# Patient Record
Sex: Female | Born: 1965 | Race: White | Hispanic: No | Marital: Married | Smoking: Former smoker
Health system: Southern US, Community
[De-identification: ages and names within clinical notes are randomized; demographics above are authoritative.]

## PROBLEM LIST (undated history)

## (undated) DIAGNOSIS — E785 Hyperlipidemia, unspecified: Secondary | ICD-10-CM

## (undated) DIAGNOSIS — N390 Urinary tract infection, site not specified: Secondary | ICD-10-CM

## (undated) DIAGNOSIS — M5430 Sciatica, unspecified side: Secondary | ICD-10-CM

## (undated) DIAGNOSIS — K760 Fatty (change of) liver, not elsewhere classified: Secondary | ICD-10-CM

## (undated) HISTORY — PX: ABDOMINAL HYSTERECTOMY: SHX81

## (undated) HISTORY — PX: TONSILLECTOMY: SUR1361

## (undated) HISTORY — DX: Sciatica, unspecified side: M54.30

## (undated) HISTORY — DX: Urinary tract infection, site not specified: N39.0

## (undated) HISTORY — DX: Hyperlipidemia, unspecified: E78.5

## (undated) HISTORY — DX: Fatty (change of) liver, not elsewhere classified: K76.0

---

## 2012-04-13 HISTORY — PX: AUGMENTATION MAMMAPLASTY: SUR837

## 2015-12-17 LAB — LIPID PANEL
Cholesterol: 193 (ref 0–200)
HDL: 58 (ref 35–70)
LDL CALC: 112
TRIGLYCERIDES: 116 (ref 40–160)

## 2015-12-17 LAB — HEMOGLOBIN A1C: Hemoglobin A1C: 5.4

## 2016-03-18 LAB — HEPATIC FUNCTION PANEL
ALK PHOS: 55 (ref 25–125)
ALT: 24 (ref 7–35)
AST: 18 (ref 13–35)
BILIRUBIN, TOTAL: 0.4

## 2016-03-18 LAB — LIPID PANEL
CHOLESTEROL: 199 (ref 0–200)
HDL: 62 (ref 35–70)
LDL Cholesterol: 115
TRIGLYCERIDES: 111 (ref 40–160)

## 2016-03-18 LAB — CBC AND DIFFERENTIAL
HCT: 43 (ref 36–46)
Hemoglobin: 14.6 (ref 12.0–16.0)

## 2016-03-18 LAB — BASIC METABOLIC PANEL
BUN: 18 (ref 4–21)
Creatinine: 18 — AB (ref 0.5–1.1)
GLUCOSE: 97
Potassium: 4.2 (ref 3.4–5.3)
SODIUM: 140 (ref 137–147)

## 2016-03-18 LAB — HEMOGLOBIN A1C: HEMOGLOBIN A1C: 5.2

## 2016-12-04 ENCOUNTER — Encounter: Payer: Self-pay | Admitting: Family Medicine

## 2016-12-04 ENCOUNTER — Ambulatory Visit (INDEPENDENT_AMBULATORY_CARE_PROVIDER_SITE_OTHER): Payer: 59 | Admitting: Family Medicine

## 2016-12-04 VITALS — BP 110/76 | HR 72 | Resp 12 | Ht 65.0 in | Wt 173.2 lb

## 2016-12-04 DIAGNOSIS — R160 Hepatomegaly, not elsewhere classified: Secondary | ICD-10-CM | POA: Diagnosis not present

## 2016-12-04 DIAGNOSIS — Z789 Other specified health status: Secondary | ICD-10-CM

## 2016-12-04 DIAGNOSIS — Z111 Encounter for screening for respiratory tuberculosis: Secondary | ICD-10-CM

## 2016-12-04 DIAGNOSIS — Z23 Encounter for immunization: Secondary | ICD-10-CM

## 2016-12-04 DIAGNOSIS — E785 Hyperlipidemia, unspecified: Secondary | ICD-10-CM

## 2016-12-04 DIAGNOSIS — Z020 Encounter for examination for admission to educational institution: Secondary | ICD-10-CM | POA: Diagnosis not present

## 2016-12-04 DIAGNOSIS — K769 Liver disease, unspecified: Secondary | ICD-10-CM | POA: Diagnosis not present

## 2016-12-04 MED ORDER — SIMVASTATIN 20 MG PO TABS: 20.0000 mg | ORAL_TABLET | Freq: Every day | ORAL | 3 refills | Status: DC

## 2016-12-04 NOTE — Progress Notes (Signed)
HPI:   Erica Dickson is a 51 y.o. female, who is here today to establish care.  Former PCP: Dr Sim Boast Last preventive routine visit: 03/2016 and gyn preventive in 04/2016 (Dr Mancel Bale).  She lives with her husband and 67 yo daughter. Chronic medical problems: HLD, liver mass right lobe, sciatic pain, hepatic steatosis, and elevated LFT's among some.   Concerns today: Completion of form for school, LPN program. She reports vaccines as up to date but does not have records. She is from [place] and had varicella during childhood.  She also would like labs done, including TSH, denies prior Hx of thyroid vaccines.  HLD, currently she is on   Liver mass: According to pt, last year she had 2 liver MRI and liver mass has ben stable.Dx incidentally by her gyn on 09/10/2015. She took OCP's for short period of time in the past. She has followed with Dr Edison Pace (hepatologists) at [facility] and has had otherwise negative work up: Iron 115, ferritin 133, tibc 324 Celiac neg Ceruloplasmin 29 ASMA neg ANA neg IgG nml HCV ab neg HBV sAg neg Also AFP , Ca Ag 19-9,and CEA in normal range.  Annual f/u with MRI was recommended, because she recently relocated she prefers to have MRI arrange locally, she is cancelling MRI appt at [facility].  She denies abdominal pain, nausea or vomiting. Denies Hx of alcohol abuse.  MRI of abdomen w and w/o contrast 01/2016:  No significant growth in the previously identified two sites of focal nodular hyperplasia. Hepatic steatosis. Redemonstration of minimally T1 hypointense and T2 hyperintense hepatic lesions; segment VII 3.4 x 3.2 x 3.6 cm lesion was previously measured 3.5 x 3.5 cm; segment IV B 5.9 x 3.7 x 8.0 cm lesion was previously measured 6.4 x 4.3 cm   Review of Systems  Constitutional: Negative for activity change, appetite change, fatigue, fever and unexpected weight change.  HENT: Negative for mouth sores, nosebleeds and trouble swallowing.   Eyes:  Negative for redness and visual disturbance.  Respiratory: Negative for cough, shortness of breath and wheezing.   Cardiovascular: Negative for chest pain, palpitations and leg swelling.  Gastrointestinal: Negative for abdominal pain, nausea and vomiting.       Negative for changes in bowel habits.  Endocrine: Negative for cold intolerance, heat intolerance, polydipsia, polyphagia and polyuria.  Genitourinary: Negative for decreased urine volume, dysuria and hematuria.  Musculoskeletal: Negative for gait problem and myalgias.  Skin: Negative for rash.  Neurological: Negative for syncope, weakness, numbness and headaches.  Hematological: Negative for adenopathy. Does not bruise/bleed easily.  Psychiatric/Behavioral: Negative for confusion and sleep disturbance. The patient is nervous/anxious.     No current outpatient prescriptions on file prior to visit.   No current facility-administered medications on file prior to visit.      Past Medical History:  Diagnosis Date  . Hepatic steatosis    MRI 01/2016  . Hyperlipidemia   . Sciatic pain   . Urinary tract infection    Past Surgical History:  Procedure Laterality Date  . ABDOMINAL HYSTERECTOMY    . TONSILLECTOMY      No Known Allergies  Family History  Problem Relation Age of Onset  . Hyperlipidemia Mother   . Heart disease Mother   . Hypertension Mother   . Stroke Father   . Diabetes Father   . Heart disease Brother 22       MI    Social History   Social History  . Marital  status: Married    Spouse name: N/A  . Number of children: N/A  . Years of education: N/A   Social History Main Topics  . Smoking status: Former Research scientist (life sciences)  . Smokeless tobacco: Never Used  . Alcohol use Yes  . Drug use: No  . Sexual activity: Not Asked   Other Topics Concern  . None   Social History Narrative  . None    Vitals:   12/04/16 1348  BP: 110/76  Pulse: 72  Resp: 12  SpO2: 98%    Body mass index is 28.83  kg/m.  Physical Exam  Nursing note and vitals reviewed. Constitutional: She is oriented to person, place, and time. She appears well-developed. No distress.  HENT:  Head: Normocephalic and atraumatic.  Right Ear: Hearing, tympanic membrane, external ear and ear canal normal.  Left Ear: Hearing, tympanic membrane, external ear and ear canal normal.  Mouth/Throat: Oropharynx is clear and moist and mucous membranes are normal.  Eyes: Pupils are equal, round, and reactive to light. Conjunctivae and EOM are normal.  Cardiovascular: Normal rate and regular rhythm.   No murmur heard. Pulses:      Dorsalis pedis pulses are 2+ on the right side, and 2+ on the left side.  Respiratory: Effort normal and breath sounds normal. No respiratory distress.  GI: Soft. She exhibits no mass. There is no hepatomegaly. There is no tenderness.  Musculoskeletal: She exhibits no edema.  Lymphadenopathy:    She has no cervical adenopathy.  Neurological: She is alert and oriented to person, place, and time. She has normal strength. No cranial nerve deficit. Coordination and gait normal.  Skin: Skin is warm. No erythema.  Psychiatric: She has a normal mood and affect.  Well groomed, good eye contact.    ASSESSMENT AND PLAN:   Ms. Parisa was seen today for establish care.  Diagnoses and all orders for this visit:  School health examination  Form completed. MMR given. Varicella titers adding to future lab.  Hyperlipidemia, unspecified hyperlipidemia type  No changes in current management. We will follow labs and give further recommendations accordingly. F/U in 6-12 months.  -     simvastatin (ZOCOR) 20 MG tablet; Take 1 tablet (20 mg total) by mouth daily. -     Comprehensive metabolic panel; Future -     Lipid panel; Future -     TSH; Future  Varicella vaccination status unknown -     Varicella Zoster Antibody, IgG; Future  Screening-pulmonary TB -     Quantiferon tb gold assay (blood);  Future  Liver mass, right lobe  Asymptomatic. Has had abnormal LFT's. Records from Dr Edison Pace at [facility] reviewed. Abdominal MRI W and w/o contrast ordered, she is due in 01/2017.  -     MR Abdomen W Wo Contrast; Future  Liver disease -     MR Abdomen W Wo Contrast; Future  Need for MMR vaccine -     MMR vaccine subcutaneous     Lugene Beougher G. [place], MD  [facility]. [place] office.

## 2016-12-04 NOTE — Patient Instructions (Addendum)
A few things to remember from today's visit:   Varicella vaccination status unknown - Plan: Varicella Zoster Antibody, IgG  Hyperlipidemia, unspecified hyperlipidemia type - Plan: simvastatin (ZOCOR) 20 MG tablet, Comprehensive metabolic panel, Lipid panel, TSH  Screening-pulmonary TB - Plan: Quantiferon tb gold assay (blood)  I will review records allopurinol about a liver MRI.   Please be sure medication list is accurate. If a new problem present, please set up appointment sooner than planned today.

## 2016-12-09 ENCOUNTER — Other Ambulatory Visit (INDEPENDENT_AMBULATORY_CARE_PROVIDER_SITE_OTHER): Payer: 59

## 2016-12-09 DIAGNOSIS — Z111 Encounter for screening for respiratory tuberculosis: Secondary | ICD-10-CM

## 2016-12-09 DIAGNOSIS — E785 Hyperlipidemia, unspecified: Secondary | ICD-10-CM

## 2016-12-09 DIAGNOSIS — Z789 Other specified health status: Secondary | ICD-10-CM | POA: Diagnosis not present

## 2016-12-09 LAB — COMPREHENSIVE METABOLIC PANEL
ALBUMIN: 4.6 g/dL (ref 3.5–5.2)
ALK PHOS: 53 U/L (ref 39–117)
ALT: 18 U/L (ref 0–35)
AST: 20 U/L (ref 0–37)
BILIRUBIN TOTAL: 0.6 mg/dL (ref 0.2–1.2)
BUN: 15 mg/dL (ref 6–23)
CALCIUM: 9.6 mg/dL (ref 8.4–10.5)
CO2: 28 mEq/L (ref 19–32)
CREATININE: 0.82 mg/dL (ref 0.40–1.20)
Chloride: 101 mEq/L (ref 96–112)
GFR: 77.94 mL/min (ref >60.00)
Glucose, Bld: 97 mg/dL (ref 70–99)
Potassium: 4.2 mEq/L (ref 3.5–5.1)
Sodium: 136 mEq/L (ref 135–145)
Total Protein: 7.3 g/dL (ref 6.0–8.3)

## 2016-12-09 LAB — LIPID PANEL
CHOLESTEROL: 201 mg/dL — AB (ref 0–200)
HDL: 59.3 mg/dL (ref >39.00)
LDL Cholesterol: 118 mg/dL — ABNORMAL HIGH (ref 0–99)
NonHDL: 142.02
TRIGLYCERIDES: 120 mg/dL (ref 0.0–149.0)
Total CHOL/HDL Ratio: 3
VLDL: 24 mg/dL (ref 0.0–40.0)

## 2016-12-09 LAB — TSH: TSH: 2.16 u[IU]/mL (ref 0.35–4.50)

## 2016-12-10 LAB — VARICELLA ZOSTER ANTIBODY, IGG: VARICELLA IGG: 3007 {index} — AB (ref <135.00)

## 2016-12-11 LAB — QUANTIFERON TB GOLD ASSAY (BLOOD)
INTERFERON GAMMA RELEASE ASSAY: NEGATIVE
QUANTIFERON TB AG MINUS NIL: 0 [IU]/mL
Quantiferon Nil Value: 0.04 IU/mL

## 2017-01-18 ENCOUNTER — Telehealth: Payer: Self-pay | Admitting: *Deleted

## 2017-01-18 ENCOUNTER — Ambulatory Visit
Admission: RE | Admit: 2017-01-18 | Discharge: 2017-01-18 | Disposition: A | Discharge status: Home / Self Care | Payer: 59 | Source: Ambulatory Visit | Attending: Family Medicine | Admitting: Family Medicine

## 2017-01-18 DIAGNOSIS — R160 Hepatomegaly, not elsewhere classified: Secondary | ICD-10-CM

## 2017-01-18 DIAGNOSIS — K769 Liver disease, unspecified: Secondary | ICD-10-CM

## 2017-01-18 MED ORDER — GADOBENATE DIMEGLUMINE 529 MG/ML IV SOLN
15.0000 mL | Freq: Once | INTRAVENOUS | Status: AC | PRN
  Administered 2017-01-18: 15 mL via INTRAVENOUS

## 2017-01-18 NOTE — Telephone Encounter (Signed)
Morrie Sheldon from [facility] called requesting reports from a prior MRI of the liver that was performed at [facility].  Please fax to [phone] attn: MRI dept.

## 2017-01-19 NOTE — Telephone Encounter (Signed)
MRI faxed to [facility].

## 2017-01-19 NOTE — Telephone Encounter (Signed)
We do not have the MRI on file, I will contact [facility] to have them fax it over to [place].

## 2017-01-19 NOTE — Telephone Encounter (Signed)
[facility] will be faxing over both copies of patient's previous liver MRI's.

## 2017-01-21 ENCOUNTER — Telehealth: Payer: Self-pay | Admitting: Family Medicine

## 2017-01-21 NOTE — Telephone Encounter (Signed)
° ° °  Pt call and would like a call back about her MRI results

## 2017-01-22 NOTE — Telephone Encounter (Signed)
As of now, the MRI has not been read yet.

## 2017-01-28 ENCOUNTER — Telehealth: Payer: Self-pay | Admitting: Family Medicine

## 2017-01-28 NOTE — Telephone Encounter (Signed)
Pt is calling to get the MR results from 01/18/17.

## 2017-01-29 ENCOUNTER — Encounter: Payer: Self-pay | Admitting: Family Medicine

## 2017-01-29 NOTE — Telephone Encounter (Signed)
The results have been entered.

## 2017-01-29 NOTE — Telephone Encounter (Signed)
I called and left a message with [facility]. The MRI has still not been read at this time. They will look into it and give us a call back.

## 2017-01-29 NOTE — Telephone Encounter (Signed)
Received a call back from [facility]. They will have the results in for the MRI today.

## 2017-02-01 NOTE — Telephone Encounter (Signed)
Results sent to patient

## 2017-03-24 ENCOUNTER — Other Ambulatory Visit: Payer: Self-pay | Admitting: Family Medicine

## 2017-03-24 DIAGNOSIS — Z1231 Encounter for screening mammogram for malignant neoplasm of breast: Secondary | ICD-10-CM

## 2017-04-22 ENCOUNTER — Ambulatory Visit
Admission: RE | Admit: 2017-04-22 | Discharge: 2017-04-22 | Disposition: A | Discharge status: Home / Self Care | Payer: 59 | Source: Ambulatory Visit | Attending: Family Medicine | Admitting: Family Medicine

## 2017-04-22 DIAGNOSIS — Z1231 Encounter for screening mammogram for malignant neoplasm of breast: Secondary | ICD-10-CM

## 2017-12-27 ENCOUNTER — Telehealth: Payer: Self-pay

## 2017-12-27 NOTE — Telephone Encounter (Signed)
Copied from CRM [phone]. Topic: Inquiry >> Dec 27, 2017  3:23 PM Debroah LoopLander, Lumin L wrote: Reason for CRM: Patient would like to transfer PCP from Dr. Sw[place]il[place]Jordan at [facility] to Dr. Doreene BurkeKremer at [facility]. Please advise on whether request is approved or denied.   Dr. SwazilandJordan - Please advise on transfer request. Thank you!

## 2017-12-27 NOTE — Telephone Encounter (Signed)
It is fine with me. Thanks, BJ 

## 2017-12-28 NOTE — Telephone Encounter (Signed)
Dr. SwazilandJordan has approved transfer request. Please forward to Dr. Doreene BurkeKremer. Thank you!

## 2017-12-29 NOTE — Telephone Encounter (Signed)
Dr. Doreene BurkeKremer, please advise if transfer is okay.

## 2017-12-29 NOTE — Telephone Encounter (Signed)
See below

## 2017-12-30 NOTE — Telephone Encounter (Signed)
Okay to transfer  

## 2017-12-30 NOTE — Telephone Encounter (Signed)
Okay to schedule patient. 

## 2018-02-09 ENCOUNTER — Other Ambulatory Visit: Payer: Self-pay | Admitting: Family Medicine

## 2018-02-09 DIAGNOSIS — E785 Hyperlipidemia, unspecified: Secondary | ICD-10-CM

## 2018-02-09 NOTE — Telephone Encounter (Signed)
I called and left message on patient voice mail to call office and schedule appointment to see Dr. Doreene Burke. TOC-was approved to transfer to [facility]. (PEC - when patient returns call ok to schedule TOC with Dr. Doreene Burke.

## 2018-02-09 NOTE — Telephone Encounter (Signed)
There is no documentation this pt has been contacted re: TOC to [facility]. Please contact pt and schedule appt. Thank you.

## 2018-02-10 ENCOUNTER — Encounter: Payer: Self-pay | Admitting: Family Medicine

## 2018-02-10 ENCOUNTER — Ambulatory Visit (INDEPENDENT_AMBULATORY_CARE_PROVIDER_SITE_OTHER): Payer: PRIVATE HEALTH INSURANCE | Admitting: Family Medicine

## 2018-02-10 VITALS — BP 110/80 | HR 62 | Temp 97.8°F | Wt 163.0 lb

## 2018-02-10 DIAGNOSIS — E782 Mixed hyperlipidemia: Secondary | ICD-10-CM | POA: Diagnosis not present

## 2018-02-10 DIAGNOSIS — K7689 Other specified diseases of liver: Secondary | ICD-10-CM | POA: Diagnosis not present

## 2018-02-10 NOTE — Patient Instructions (Signed)
 Dyslipidemia Dyslipidemia is an imbalance of waxy, fat-like substances (lipids) in the blood. The body needs lipids in small amounts. Dyslipidemia often involves a high level of cholesterol or triglycerides, which are types of lipids. Common forms of dyslipidemia include:  High levels of bad cholesterol (LDL cholesterol). LDL is the type of cholesterol that causes fatty deposits (plaques) to build up in the blood vessels that carry blood away from your heart (arteries).  Low levels of good cholesterol (HDL cholesterol). HDL cholesterol is the type of cholesterol that protects against heart disease. High levels of HDL remove the LDL buildup from arteries.  High levels of triglycerides. Triglycerides are a fatty substance in the blood that is linked to a buildup of plaques in the arteries.  You can develop dyslipidemia because of the genes you are born with (primary dyslipidemia) or changes that occur during your life (secondary dyslipidemia), or as a side effect of certain medical treatments. What are the causes? Primary dyslipidemia is caused by changes (mutations) in genes that are passed down through families (inherited). These mutations cause several types of dyslipidemia. Mutations can result in disorders that make the body produce too much LDL cholesterol or triglycerides, or not enough HDL cholesterol. These disorders may lead to heart disease, arterial disease, or stroke at an early age. Causes of secondary dyslipidemia include certain lifestyle choices and diseases that lead to dyslipidemia, such as:  Eating a diet that is high in animal fat.  Not getting enough activity or exercise (having a sedentary lifestyle).  Having diabetes, kidney disease, liver disease, or thyroid disease.  Drinking large amounts of alcohol.  Using certain types of drugs.  What increases the risk? You may be at greater risk for dyslipidemia if you are an older man or if you are a woman who has gone  through menopause. Other risk factors include:  Having a family history of dyslipidemia.  Taking certain medicines, including birth control pills, steroids, some diuretics, beta-blockers, and some medicines forHIV.  Smoking cigarettes.  Eating a high-fat diet.  Drinking large amounts of alcohol.  Having certain medical conditions such as diabetes, polycystic ovary syndrome (PCOS), pregnancy, kidney disease, liver disease, or hypothyroidism.  Not exercising regularly.  Being overweight or obese with too much belly fat.  What are the signs or symptoms? Dyslipidemia does not usually cause any symptoms. Very high lipid levels can cause fatty bumps under the skin (xanthomas) or a white or gray ring around the black center (pupil) of the eye. Very high triglyceride levels can cause inflammation of the pancreas (pancreatitis). How is this diagnosed? Your health care provider may diagnose dyslipidemia based on a routine blood test (fasting blood test). Because most people do not have symptoms of the condition, this blood testing (lipid profile) is done on adults age 20 and older and is repeated every 5 years. This test checks:  Total cholesterol. This is a measure of the total amount of cholesterol in your blood, including LDL cholesterol, HDL cholesterol, and triglycerides. A healthy number is below 200.  LDL cholesterol. The target number for LDL cholesterol is different for each person, depending on individual risk factors. For most people, a number below 100 is healthy. Ask your health care provider what your LDL cholesterol number should be.  HDL cholesterol. An HDL level of 60 or higher is best because it helps to protect against heart disease. A number below 40 for men or below 50 for women increases the risk for heart disease.  Triglycerides.   A healthy triglyceride number is below 150.  If your lipid profile is abnormal, your health care provider may do other blood tests to get more  information about your condition. How is this treated? Treatment depends on the type of dyslipidemia that you have and your other risk factors for heart disease and stroke. Your health care provider will have a target range for your lipid levels based on this information. For many people, treatment starts with lifestyle changes, such as diet and exercise. Your health care provider may recommend that you:  Get regular exercise.  Make changes to your diet.  Quit smoking if you smoke.  If diet changes and exercise do not help you reach your goals, your health care provider may also prescribe medicine to lower lipids. The most commonly prescribed type of medicine lowers your LDL cholesterol (statin drug). If you have a high triglyceride level, your provider may prescribe another type of drug (fibrate) or an omega-3 fish oil supplement, or both. Follow these instructions at home:  Take over-the-counter and prescription medicines only as told by your health care provider. This includes supplements.  Get regular exercise. Start an aerobic exercise and strength training program as told by your health care provider. Ask your health care provider what activities are safe for you. Your health care provider may recommend: ? 30 minutes of aerobic activity 4-6 days a week. Brisk walking is an example of aerobic activity. ? Strength training 2 days a week.  Eat a healthy diet as told by your health care provider. This can help you reach and maintain a healthy weight, lower your LDL cholesterol, and raise your HDL cholesterol. It may help to work with a diet and nutrition specialist (dietitian) to make a plan that is right for you. Your dietitian or health care provider may recommend: ? Limiting your calories, if you are overweight. ? Eating more fruits, vegetables, whole grains, fish, and lean meats. ? Limiting saturated fat, trans fat, and cholesterol.  Follow instructions from your health care provider  or dietitian about eating or drinking restrictions.  Limit alcohol intake to no more than one drink per day for nonpregnant women and two drinks per day for men. One drink equals 12 oz of beer, 5 oz of wine, or 1 oz of hard liquor.  Do not use any products that contain nicotine or tobacco, such as cigarettes and e-cigarettes. If you need help quitting, ask your health care provider.  Keep all follow-up visits as told by your health care provider. This is important. Contact a health care provider if:  You are having trouble sticking to your exercise or diet plan.  You are struggling to quit smoking or control your use of alcohol. Summary  Dyslipidemia is an imbalance of waxy, fat-like substances (lipids) in the blood. The body needs lipids in small amounts. Dyslipidemia often involves a high level of cholesterol or triglycerides, which are types of lipids.  Treatment depends on the type of dyslipidemia that you have and your other risk factors for heart disease and stroke.  For many people, treatment starts with lifestyle changes, such as diet and exercise. Your health care provider may also prescribe medicine to lower lipids. This information is not intended to replace advice given to you by your health care provider. Make sure you discuss any questions you have with your health care provider. Document Released: 04/04/2013 Document Revised: 11/25/2015 Document Reviewed: 11/25/2015 Elsevier Interactive Patient Education  2018 Elsevier Inc.  

## 2018-02-10 NOTE — Progress Notes (Addendum)
Subjective:  Patient ID: Erica Dickson, female    DOB: Dec 06, 1965  Age: 51 y.o. MRN: [id number]  CC: Annual Exam   HPI Erica Dickson presents for follow-up of her elevated cholesterol.  She has been off of her Zocor for the last 9 months.  She is lost weight and been enjoying a heart healthy diet.  [facility].  She quit smoking 15 years ago.  She drinks rarely.  She lives with her husband.  Together they have a daughter who is now in the nursing school in [place].  There are 2 children from a previous marriage on the husband's side.  Patient has had a hysterectomy.  She has Pap smears every 2 to 3 years with her GYN.  She had a colonoscopy that was normal and is not due for another one until she turns 60.  At some point she was diagnosed with liver masses believed to be focal nodular hyperplasia.  MRIs done a year apart in 2017 and 2018 showed these lesions to be stable.   Suggested follow-up for these lesions was not exactly clear .  Patient would like to avoid another MRI if possible.  Outpatient Medications Prior to Visit  Medication Sig Dispense Refill  . Multiple Vitamin (MULTI-VITAMINS) TABS Take 1 tablet by mouth daily.    . simvastatin (ZOCOR) 20 MG tablet TAKE 1 TABLET DAILY 90 tablet 0   No facility-administered medications prior to visit.     ROS Review of Systems  Constitutional: Negative.   HENT: Negative.   Eyes: Negative.   Respiratory: Negative.   Cardiovascular: Negative.   Gastrointestinal: Negative.   Endocrine: Negative for polyphagia and polyuria.  Genitourinary: Negative.   Musculoskeletal: Negative.   Neurological: Negative.   Hematological: Negative.   Psychiatric/Behavioral: Negative.     Objective:  BP 110/80   Pulse 62   Temp 97.8 F (36.6 C) (Oral)   Wt 163 lb (73.9 kg)   SpO2 97%   BMI 27.12 kg/m   BP Readings from Last 3 Encounters:  02/10/18 110/80  12/04/16 110/76    Wt Readings  from Last 3 Encounters:  02/10/18 163 lb (73.9 kg)  12/04/16 173 lb 4 oz (78.6 kg)    Physical Exam  Constitutional: She is oriented to person, place, and time. She appears well-developed and well-nourished. No distress.  HENT:  Head: Normocephalic and atraumatic.  Right Ear: External ear normal.  Left Ear: External ear normal.  Mouth/Throat: Oropharynx is clear and moist. No oropharyngeal exudate.  Eyes: Pupils are equal, round, and reactive to light. EOM are normal. Right eye exhibits no discharge. Left eye exhibits no discharge. No scleral icterus.  Neck: Neck supple. No JVD present. No tracheal deviation present. No thyromegaly present.  Cardiovascular: Normal rate, regular rhythm and normal heart sounds.  Pulmonary/Chest: Effort normal and breath sounds normal.  Musculoskeletal: She exhibits no edema.  Lymphadenopathy:    She has no cervical adenopathy.  Neurological: She is alert and oriented to person, place, and time.  Skin: Skin is warm and dry. Capillary refill takes less than 2 seconds. She is not diaphoretic.  Psychiatric: She has a normal mood and affect. Her behavior is normal.    Lab Results  Component Value Date   HGB 14.6 03/18/2016   HCT 43 03/18/2016   GLUCOSE 97 12/09/2016   CHOL 201 (H) 12/09/2016   TRIG 120.0 12/09/2016   HDL 59.30 12/09/2016  LDLCALC 118 (H) 12/09/2016   ALT 18 12/09/2016   AST 20 12/09/2016   NA 136 12/09/2016   K 4.2 12/09/2016   CL 101 12/09/2016   CREATININE 0.82 12/09/2016   BUN 15 12/09/2016   CO2 28 12/09/2016   TSH 2.16 12/09/2016   HGBA1C 5.2 03/18/2016    Mm Screening Breast W/implant Tomo Bilateral  Result Date: 04/22/2017 CLINICAL DATA:  Screening. EXAM: DIGITAL SCREENING BILATERAL MAMMOGRAM WITH IMPLANTS, CAD AND ADJUNCT TOMO The patient has retropectoral implants. Standard and implant displaced views were performed. COMPARISON:  Previous exam(s). ACR Breast Density Category b: There are scattered areas of  fibroglandular density. FINDINGS: There are no findings suspicious for malignancy. Images were processed with CAD. IMPRESSION: No mammographic evidence of malignancy. A result letter of this screening mammogram will be mailed directly to the patient. RECOMMENDATION: Screening mammogram in one year. (Code:SM-B-01Y) BI-RADS CATEGORY  1:  Negative. Electronically Signed   By: Gerome Sam III M.D   On: 04/22/2017 16:45   The 10-year ASCVD risk score Denman George DC Montez Hageman., et al., 2013) is: 1%   Values used to calculate the score:     Age: 35 years     Sex: Female     Is Non-Hispanic African American: No     Diabetic: No     Tobacco smoker: No     Systolic Blood Pressure: 110 mmHg     Is BP treated: No     HDL Cholesterol: 82 mg/dL     Total Cholesterol: 260 mg/dL Assessment & Plan:   Erica Dickson was seen today for annual exam.  Diagnoses and all orders for this visit:  Mixed hyperlipidemia -     Cancel: CBC -     Cancel: Comprehensive metabolic panel -     Cancel: Lipid panel -     CBC -     Comprehensive metabolic panel -     Lipid panel -     Urinalysis, Routine w reflex microscopic  Hepatic focal nodular hyperplasia -     US Abdomen Limited; Future -     CBC -     Comprehensive metabolic panel  Other orders -     Cancel: Urinalysis, Routine w reflex microscopic   I have discontinued Erica Dickson's simvastatin. I am also having her maintain her MULTI-VITAMINS.  No orders of the defined types were placed in this encounter.  We will see where patient's lipid profile is off of Zocor.  Patient has improved her diet immensely and is exercising regularly.  Anticipatory guidance was given to the patient for dyslipidemia.  Have ordered ultrasound to follow-up the patient's liver masses.  There is been to back to back MRIs showed these lesions to be stable.  Patient would like to avoid another MRI if possible.  will consider GI referral as needed.  Follow-up: Return in about 6 months (around  08/11/2018).  Mliss Sax, MD

## 2018-02-11 ENCOUNTER — Telehealth: Payer: Self-pay | Admitting: Family Medicine

## 2018-02-11 DIAGNOSIS — K7689 Other specified diseases of liver: Secondary | ICD-10-CM

## 2018-02-11 LAB — LIPID PANEL
CHOL/HDL RATIO: 3.2 (calc) (ref <5.0)
CHOLESTEROL: 260 mg/dL — AB (ref <200)
HDL: 82 mg/dL (ref >50)
LDL CHOLESTEROL (CALC): 156 mg/dL — AB
NON-HDL CHOLESTEROL (CALC): 178 mg/dL — AB (ref <130)
Triglycerides: 106 mg/dL (ref <150)

## 2018-02-11 LAB — COMPREHENSIVE METABOLIC PANEL
AG RATIO: 1.5 (calc) (ref 1.0–2.5)
ALBUMIN MSPROF: 4.4 g/dL (ref 3.6–5.1)
ALKALINE PHOSPHATASE (APISO): 50 U/L (ref 33–130)
ALT: 21 U/L (ref 6–29)
AST: 25 U/L (ref 10–35)
BILIRUBIN TOTAL: 0.5 mg/dL (ref 0.2–1.2)
BUN: 16 mg/dL (ref 7–25)
CO2: 26 mmol/L (ref 20–32)
Calcium: 9.7 mg/dL (ref 8.6–10.4)
Chloride: 103 mmol/L (ref 98–110)
Creat: 0.93 mg/dL (ref 0.50–1.05)
Globulin: 3 g/dL (calc) (ref 1.9–3.7)
Glucose, Bld: 97 mg/dL (ref 65–99)
POTASSIUM: 4.2 mmol/L (ref 3.5–5.3)
Sodium: 138 mmol/L (ref 135–146)
Total Protein: 7.4 g/dL (ref 6.1–8.1)

## 2018-02-11 LAB — CBC
HCT: 42.2 % (ref 35.0–45.0)
Hemoglobin: 14.2 g/dL (ref 11.7–15.5)
MCH: 31.8 pg (ref 27.0–33.0)
MCHC: 33.6 g/dL (ref 32.0–36.0)
MCV: 94.6 fL (ref 80.0–100.0)
MPV: 10.7 fL (ref 7.5–12.5)
PLATELETS: 274 10*3/uL (ref 140–400)
RBC: 4.46 10*6/uL (ref 3.80–5.10)
RDW: 11.8 % (ref 11.0–15.0)
WBC: 5 10*3/uL (ref 3.8–10.8)

## 2018-02-11 LAB — URINALYSIS, ROUTINE W REFLEX MICROSCOPIC
BILIRUBIN URINE: NEGATIVE
Bacteria, UA: NONE SEEN /HPF
GLUCOSE, UA: NEGATIVE
Hgb urine dipstick: NEGATIVE
Hyaline Cast: NONE SEEN /LPF
Ketones, ur: NEGATIVE
NITRITE: NEGATIVE
PH: 6.5 (ref 5.0–8.0)
Protein, ur: NEGATIVE
SPECIFIC GRAVITY, URINE: 1.01 (ref 1.001–1.03)

## 2018-02-11 NOTE — Addendum Note (Signed)
Addended by: Marcell Anger E on: 02/11/2018 04:22 PM   Modules accepted: Orders

## 2018-02-11 NOTE — Addendum Note (Signed)
Addended by: Marcell Anger E on: 02/11/2018 04:38 PM   Modules accepted: Orders

## 2018-02-11 NOTE — Telephone Encounter (Signed)
Pt calling in.  She states that she needs to go to [facility] to have this done per her insurance it will be 6 times more expensive at [facility].

## 2018-02-11 NOTE — Telephone Encounter (Signed)
Order changed & patient is aware.

## 2018-02-11 NOTE — Telephone Encounter (Signed)
Copied from CRM [id number]. Topic: Quick Communication - See Telephone Encounter >> Feb 11, 2018  4:03 PM Jens Som A wrote: CRM for notification. See Telephone encounter for: 02/11/18. Thayer Ohm is calling from the [facility] in [employer] Patients order were entered incorrectly.  Order should be right upper quadrant Please advise Patient is scheduled to come in tomorrow at 10:30a. Please advise [phone] If Orders can be reissued.  Can Nurse call to give verbal.

## 2018-02-11 NOTE — Telephone Encounter (Signed)
Verbal given to [facility].

## 2018-02-12 ENCOUNTER — Ambulatory Visit (HOSPITAL_BASED_OUTPATIENT_CLINIC_OR_DEPARTMENT_OTHER): Payer: PRIVATE HEALTH INSURANCE

## 2018-02-12 ENCOUNTER — Ambulatory Visit (HOSPITAL_BASED_OUTPATIENT_CLINIC_OR_DEPARTMENT_OTHER): Admission: RE | Admit: 2018-02-12 | Payer: PRIVATE HEALTH INSURANCE | Source: Ambulatory Visit

## 2018-02-17 ENCOUNTER — Ambulatory Visit
Admission: RE | Admit: 2018-02-17 | Discharge: 2018-02-17 | Disposition: A | Discharge status: Home / Self Care | Payer: PRIVATE HEALTH INSURANCE | Source: Ambulatory Visit | Attending: Family Medicine | Admitting: Family Medicine

## 2018-02-17 DIAGNOSIS — K7689 Other specified diseases of liver: Secondary | ICD-10-CM

## 2018-03-30 ENCOUNTER — Encounter: Payer: Self-pay | Admitting: Family Medicine

## 2018-03-30 ENCOUNTER — Ambulatory Visit (INDEPENDENT_AMBULATORY_CARE_PROVIDER_SITE_OTHER): Payer: PRIVATE HEALTH INSURANCE | Admitting: Family Medicine

## 2018-03-30 VITALS — BP 120/70 | HR 96 | Temp 97.6°F | Ht 65.0 in | Wt 162.0 lb

## 2018-03-30 DIAGNOSIS — L02215 Cutaneous abscess of perineum: Secondary | ICD-10-CM

## 2018-03-30 MED ORDER — HYDROCODONE-ACETAMINOPHEN 7.5-325 MG PO TABS: 1.0000 | ORAL_TABLET | Freq: Four times a day (QID) | ORAL | 0 refills | Status: DC | PRN

## 2018-03-30 MED ORDER — AMOXICILLIN-POT CLAVULANATE 875-125 MG PO TABS: 1.0000 | ORAL_TABLET | Freq: Two times a day (BID) | ORAL | 0 refills | Status: DC

## 2018-03-30 NOTE — Patient Instructions (Signed)
Incision and Drainage, Care After  Refer to this sheet in the next few weeks. These instructions provide you with information about caring for yourself after your procedure. Your health care provider may also give you more specific instructions. Your treatment has been planned according to current medical practices, but problems sometimes occur. Call your health care provider if you have any problems or questions after your procedure.  What can I expect after the procedure?  After the procedure, it is common to have:  · Pain or discomfort around your incision site.  · Drainage from your incision.  Follow these instructions at home:  · Take over-the-counter and prescription medicines only as told by your health care provider.  · If you were prescribed an antibiotic medicine, take it as told by your health care provider. Do not stop taking the antibiotic even if you start to feel better.  · Follow instructions from your health care provider about:  ? How to take care of your incision.  ? When and how you should change your packing and bandage (dressing). Wash your hands with soap and water before you change your dressing. If soap and water are not available, use hand sanitizer.  ? When you should remove your dressing.  · Do not take baths, swim, or use a hot tub until your health care provider approves.  · Keep all follow-up visits as told by your health care provider. This is important.  · Check your incision area every day for signs of infection. Check for:  ? More redness, swelling, or pain.  ? More fluid or blood.  ? Warmth.  ? Pus or a bad smell.  Contact a health care provider if:  · Your cyst or abscess returns.  · You have a fever.  · You have more redness, swelling, or pain around your incision.  · You have more fluid or blood coming from your incision.  · Your incision feels warm to the touch.  · You have pus or a bad smell coming from your incision.  Get help right away if:  · You have severe pain or  bleeding.  · You cannot eat or drink without vomiting.  · You have decreased urine output.  · You become short of breath.  · You have chest pain.  · You cough up blood.  · The area where the incision and drainage occurred becomes numb or it tingles.  This information is not intended to replace advice given to you by your health care provider. Make sure you discuss any questions you have with your health care provider.  Document Released: 06/22/2011 Document Revised: 08/30/2015 Document Reviewed: 01/18/2015  Elsevier Interactive Patient Education © 2019 [employer]

## 2018-03-30 NOTE — Progress Notes (Signed)
Established Patient Office Visit  Subjective:  Patient ID: Erica Dickson, female    DOB: Feb 18, 1966  Age: 52 y.o. MRN: [id number]  CC:  Chief Complaint  Patient presents with  . bump on right cheek    HPI Erica Dickson presents for treatment and evaluation of a tender swelling in her left perineal area that has spontaneously drained pus.  She has a history of inclusion cyst in the past but not in this area.  She has been able to partially drain it herself.  She is increased her participation in spinning classes.  There is been no fevers or chills.  Past Medical History:  Diagnosis Date  . Hepatic steatosis    MRI 01/2016  . Hyperlipidemia   . Sciatic pain   . Urinary tract infection     Past Surgical History:  Procedure Laterality Date  . ABDOMINAL HYSTERECTOMY    . AUGMENTATION MAMMAPLASTY Bilateral 2014  . TONSILLECTOMY      Family History  Problem Relation Age of Onset  . Hyperlipidemia Mother   . Heart disease Mother   . Hypertension Mother   . Stroke Father   . Diabetes Father   . Heart disease Brother 12       MI    Social History   Socioeconomic History  . Marital status: Married    Spouse name: Not on file  . Number of children: Not on file  . Years of education: Not on file  . Highest education level: Not on file  Occupational History  . Not on file  Social Needs  . Financial resource strain: Not on file  . Food insecurity:    Worry: Not on file    Inability: Not on file  . Transportation needs:    Medical: Not on file    Non-medical: Not on file  Tobacco Use  . Smoking status: Former Games developer  . Smokeless tobacco: Never Used  Substance and Sexual Activity  . Alcohol use: Yes  . Drug use: No  . Sexual activity: Not on file  Lifestyle  . Physical activity:    Days per week: Not on file    Minutes per session: Not on file  . Stress: Not on file  Relationships  . Social connections:    Talks on phone: Not on file    Gets together: Not on file     Attends religious service: Not on file    Active member of club or organization: Not on file    Attends meetings of clubs or organizations: Not on file    Relationship status: Not on file  . Intimate partner violence:    Fear of current or ex partner: Not on file    Emotionally abused: Not on file    Physically abused: Not on file    Forced sexual activity: Not on file  Other Topics Concern  . Not on file  Social History Narrative  . Not on file    Outpatient Medications Prior to Visit  Medication Sig Dispense Refill  . Multiple Vitamin (MULTI-VITAMINS) TABS Take 1 tablet by mouth daily.     No facility-administered medications prior to visit.     No Known Allergies  ROS Review of Systems  Constitutional: Negative for chills, diaphoresis, fatigue, fever and unexpected weight change.  Respiratory: Negative.   Cardiovascular: Negative.   Gastrointestinal: Negative.  Negative for abdominal distention, abdominal pain, anal bleeding and blood in stool.  Skin: Positive for color change and wound. Negative  for pallor and rash.  Psychiatric/Behavioral: Negative.       Objective:    Physical Exam  Constitutional: She is oriented to person, place, and time. She appears well-developed and well-nourished. No distress.  HENT:  Head: Normocephalic and atraumatic.  Right Ear: External ear normal.  Left Ear: External ear normal.  Eyes: Right eye exhibits no discharge. Left eye exhibits no discharge. No scleral icterus.  Neck: No JVD present. No tracheal deviation present.  Pulmonary/Chest: Effort normal. No stridor.  Neurological: She is alert and oriented to person, place, and time.  Skin: Skin is warm and dry. She is not diaphoretic.     Psychiatric: She has a normal mood and affect. Her behavior is normal.    Incision and Drainage Procedure Note  Pre-operative Diagnosis: perineal abscess  Post-operative Diagnosis: same  Indications: induration   Anesthesia: 1%  lidocaine with epinephrine  Procedure Details  The procedure, risks and complications have been discussed in detail (including, but not limited to airway compromise, infection, bleeding) with the patient, and the patient has signed consent to the procedure.  The skin was sterilely prepped and draped over the affected area in the usual fashion. After adequate local anesthesia, I&D with a #11 blade was performed on the left perineum. Purulent drainage: present The patient was observed until stable.  Findings:   EBL: 0 cc's  Drains: 1/4 tape  Condition: Tolerated procedure well   Complications: none.   BP 120/70   Pulse 96   Temp 97.6 F (36.4 C) (Oral)   Ht 5\' 5"  (1.651 m)   Wt 162 lb (73.5 kg)   SpO2 99%   BMI 26.96 kg/m  Wt Readings from Last 3 Encounters:  03/30/18 162 lb (73.5 kg)  02/10/18 163 lb (73.9 kg)  12/04/16 173 lb 4 oz (78.6 kg)   BP Readings from Last 3 Encounters:  03/30/18 120/70  02/10/18 110/80  12/04/16 110/76   Guideline developer:  UpToDate (see UpToDate for funding source) Date Released: June 2014  Health Maintenance Due  Topic Date Due  . PAP SMEAR-Modifier  04/16/2018    There are no preventive care reminders to display for this patient.  Lab Results  Component Value Date   TSH 2.16 12/09/2016   Lab Results  Component Value Date   WBC 5.0 02/10/2018   HGB 14.2 02/10/2018   HCT 42.2 02/10/2018   MCV 94.6 02/10/2018   PLT 274 02/10/2018   Lab Results  Component Value Date   NA 138 02/10/2018   K 4.2 02/10/2018   CO2 26 02/10/2018   GLUCOSE 97 02/10/2018   BUN 16 02/10/2018   CREATININE 0.93 02/10/2018   BILITOT 0.5 02/10/2018   ALKPHOS 53 12/09/2016   AST 25 02/10/2018   ALT 21 02/10/2018   PROT 7.4 02/10/2018   ALBUMIN 4.6 12/09/2016   CALCIUM 9.7 02/10/2018   GFR 77.94 12/09/2016   Lab Results  Component Value Date   CHOL 260 (H) 02/10/2018   Lab Results  Component Value Date   HDL 82 02/10/2018   Lab  Results  Component Value Date   LDLCALC 156 (H) 02/10/2018   Lab Results  Component Value Date   TRIG 106 02/10/2018   Lab Results  Component Value Date   CHOLHDL 3.2 02/10/2018   Lab Results  Component Value Date   HGBA1C 5.2 03/18/2016      Assessment & Plan:   Problem List Items Addressed This Visit      Other  Abscess of perineum - Primary   Relevant Medications   amoxicillin-clavulanate (AUGMENTIN) 875-125 MG tablet   HYDROcodone-acetaminophen (NORCO) 7.5-325 MG tablet      Meds ordered this encounter  Medications  . amoxicillin-clavulanate (AUGMENTIN) 875-125 MG tablet    Sig: Take 1 tablet by mouth 2 (two) times daily.    Dispense:  20 tablet    Refill:  0  . HYDROcodone-acetaminophen (NORCO) 7.5-325 MG tablet    Sig: Take 1 tablet by mouth every 6 (six) hours as needed for moderate pain.    Dispense:  30 tablet    Refill:  0    Follow-up: Return friday.    Patient tolerated procedure well.  She will follow-up on Friday for repacking.

## 2018-03-31 NOTE — Progress Notes (Addendum)
Established Patient Office Visit  Subjective:  Patient ID: Erica Dickson, female    DOB: 04/02/1966  Age: 52 y.o. MRN: [id number]  CC:  Chief Complaint  Patient presents with  . Follow-up    HPI Erica PallKalina Dickson presents for packing removal.  Patient reports that the area remains tender.  Husband is concerned that abscess has extended.  There is been some drainage but that has tapered off over the last day or 2.  Patient has been afebrile.  Patient is compliant with her antibiotic and uses the pain medicine while she is at home.  She has a hard time sitting.  Past Medical History:  Diagnosis Date  . Hepatic steatosis    MRI 01/2016  . Hyperlipidemia   . Sciatic pain   . Urinary tract infection     Past Surgical History:  Procedure Laterality Date  . ABDOMINAL HYSTERECTOMY    . AUGMENTATION MAMMAPLASTY Bilateral 2014  . TONSILLECTOMY      Family History  Problem Relation Age of Onset  . Hyperlipidemia Mother   . Heart disease Mother   . Hypertension Mother   . Stroke Father   . Diabetes Father   . Heart disease Brother [id number]       MI    Social History   Socioeconomic History  . Marital status: Married    Spouse name: Not on file  . Number of children: Not on file  . Years of education: Not on file  . Highest education level: Not on file  Occupational History  . Not on file  Social Needs  . Financial resource strain: Not on file  . Food insecurity:    Worry: Not on file    Inability: Not on file  . Transportation needs:    Medical: Not on file    Non-medical: Not on file  Tobacco Use  . Smoking status: Former Games developermoker  . Smokeless tobacco: Never Used  Substance and Sexual Activity  . Alcohol use: Yes  . Drug use: No  . Sexual activity: Not on file  Lifestyle  . Physical activity:    Days per week: Not on file    Minutes per session: Not on file  . Stress: Not on file  Relationships  . Social connections:    Talks on phone: Not on file    Gets together: Not  on file    Attends religious service: Not on file    Active member of club or organization: Not on file    Attends meetings of clubs or organizations: Not on file    Relationship status: Not on file  . Intimate partner violence:    Fear of current or ex partner: Not on file    Emotionally abused: Not on file    Physically abused: Not on file    Forced sexual activity: Not on file  Other Topics Concern  . Not on file  Social History Narrative  . Not on file    Outpatient Medications Prior to Visit  Medication Sig Dispense Refill  . HYDROcodone-acetaminophen (NORCO) 7.5-325 MG tablet Take 1 tablet by mouth every 6 (six) hours as needed for moderate pain. 30 tablet 0  . Multiple Vitamin (MULTI-VITAMINS) TABS Take 1 tablet by mouth daily.    Marland Kitchen. amoxicillin-clavulanate (AUGMENTIN) 875-125 MG tablet Take 1 tablet by mouth 2 (two) times daily. 20 tablet 0   No facility-administered medications prior to visit.     No Known Allergies  ROS Review of Systems  Constitutional: Negative for chills, diaphoresis, fatigue, fever and unexpected weight change.  Skin: Positive for color change and wound. Negative for pallor and rash.  Neurological: Negative.   Psychiatric/Behavioral: Negative.       Objective:    Physical Exam  Constitutional: She is oriented to person, place, and time. She appears well-developed and well-nourished. No distress.  HENT:  Head: Normocephalic and atraumatic.  Right Ear: External ear normal.  Left Ear: External ear normal.  Eyes: Right eye exhibits no discharge. Left eye exhibits no discharge.  Pulmonary/Chest: Effort normal.  Neurological: She is alert and oriented to person, place, and time.  Skin: Skin is warm and dry. She is not diaphoretic.     Psychiatric: She has a normal mood and affect. Her behavior is normal.   Procedure Note: Packing removed. Wound cavity was injected with 3 cc of 2% without. After a few minutes , attempted to explore the  indurated areas with a hemostat. Pt did not tolerate this well.  Wound cavity was then repacked with quarter inch tape.  BP 122/80   Pulse 77   Temp 98.2 F (36.8 C) (Oral)   Ht 5\' 5"  (1.651 m)   Wt 162 lb (73.5 kg)   SpO2 98%   BMI 26.96 kg/m  Wt Readings from Last 3 Encounters:  04/01/18 162 lb (73.5 kg)  03/30/18 162 lb (73.5 kg)  02/10/18 163 lb (73.9 kg)   BP Readings from Last 3 Encounters:  04/01/18 122/80  03/30/18 120/70  02/10/18 110/80   Guideline developer:  UpToDate (see UpToDate for funding source) Date Released: June 2014  Health Maintenance Due  Topic Date Due  . PAP SMEAR-Modifier  04/16/2018    There are no preventive care reminders to display for this patient.  Lab Results  Component Value Date   TSH 2.16 12/09/2016   Lab Results  Component Value Date   WBC 5.0 02/10/2018   HGB 14.2 02/10/2018   HCT 42.2 02/10/2018   MCV 94.6 02/10/2018   PLT 274 02/10/2018   Lab Results  Component Value Date   NA 138 02/10/2018   K 4.2 02/10/2018   CO2 26 02/10/2018   GLUCOSE 97 02/10/2018   BUN 16 02/10/2018   CREATININE 0.93 02/10/2018   BILITOT 0.5 02/10/2018   ALKPHOS 53 12/09/2016   AST 25 02/10/2018   ALT 21 02/10/2018   PROT 7.4 02/10/2018   ALBUMIN 4.6 12/09/2016   CALCIUM 9.7 02/10/2018   GFR 77.94 12/09/2016   Lab Results  Component Value Date   CHOL 260 (H) 02/10/2018   Lab Results  Component Value Date   HDL 82 02/10/2018   Lab Results  Component Value Date   LDLCALC 156 (H) 02/10/2018   Lab Results  Component Value Date   TRIG 106 02/10/2018   Lab Results  Component Value Date   CHOLHDL 3.2 02/10/2018   Lab Results  Component Value Date   HGBA1C 5.2 03/18/2016      Assessment & Plan:   Problem List Items Addressed This Visit      Other   Abscess of perineum   Relevant Medications   amoxicillin-clavulanate (AUGMENTIN) 875-125 MG tablet   ketorolac (TORADOL) injection 60 mg (Completed) (Start on 04/01/2018  11:45 AM)   ketorolac (TORADOL) injection 60 mg (Start on 04/01/2018 11:45 AM)      Meds ordered this encounter  Medications  . amoxicillin-clavulanate (AUGMENTIN) 875-125 MG tablet    Sig: Take 1 tablet by mouth 2 (two)  times daily.    Dispense:  20 tablet    Refill:  0  . ketorolac (TORADOL) injection 60 mg  . ketorolac (TORADOL) injection 60 mg   Despite I&D with packing and antibiotic therapy the abscess has extended.  Patient was given 60 mg of Toradol and advised to proceed to the emergency room for possible operative I&D of her abscess.  Tawni Carnes she went to [facility] Follow-up: No follow-ups on file.

## 2018-04-01 ENCOUNTER — Ambulatory Visit (INDEPENDENT_AMBULATORY_CARE_PROVIDER_SITE_OTHER): Payer: PRIVATE HEALTH INSURANCE | Admitting: Family Medicine

## 2018-04-01 ENCOUNTER — Emergency Department (HOSPITAL_BASED_OUTPATIENT_CLINIC_OR_DEPARTMENT_OTHER): Payer: PRIVATE HEALTH INSURANCE

## 2018-04-01 ENCOUNTER — Other Ambulatory Visit: Payer: Self-pay

## 2018-04-01 ENCOUNTER — Observation Stay (HOSPITAL_BASED_OUTPATIENT_CLINIC_OR_DEPARTMENT_OTHER)
Admission: EM | Admit: 2018-04-01 | Discharge: 2018-04-03 | Disposition: A | Discharge status: Home / Self Care | Payer: PRIVATE HEALTH INSURANCE | Attending: Internal Medicine | Admitting: Internal Medicine

## 2018-04-01 ENCOUNTER — Encounter (HOSPITAL_BASED_OUTPATIENT_CLINIC_OR_DEPARTMENT_OTHER): Payer: Self-pay

## 2018-04-01 ENCOUNTER — Encounter: Payer: Self-pay | Admitting: Family Medicine

## 2018-04-01 DIAGNOSIS — E785 Hyperlipidemia, unspecified: Secondary | ICD-10-CM | POA: Insufficient documentation

## 2018-04-01 DIAGNOSIS — L02215 Cutaneous abscess of perineum: Secondary | ICD-10-CM

## 2018-04-01 DIAGNOSIS — N83202 Unspecified ovarian cyst, left side: Secondary | ICD-10-CM | POA: Diagnosis not present

## 2018-04-01 DIAGNOSIS — E782 Mixed hyperlipidemia: Secondary | ICD-10-CM

## 2018-04-01 DIAGNOSIS — L03818 Cellulitis of other sites: Secondary | ICD-10-CM

## 2018-04-01 DIAGNOSIS — Z90710 Acquired absence of both cervix and uterus: Secondary | ICD-10-CM | POA: Insufficient documentation

## 2018-04-01 DIAGNOSIS — L02214 Cutaneous abscess of groin: Principal | ICD-10-CM | POA: Diagnosis present

## 2018-04-01 DIAGNOSIS — N83209 Unspecified ovarian cyst, unspecified side: Secondary | ICD-10-CM | POA: Diagnosis not present

## 2018-04-01 DIAGNOSIS — L0390 Cellulitis, unspecified: Secondary | ICD-10-CM | POA: Diagnosis present

## 2018-04-01 LAB — CBC WITH DIFFERENTIAL/PLATELET
Abs Immature Granulocytes: 0.02 10*3/uL (ref 0.00–0.07)
BASOS PCT: 1 %
Basophils Absolute: 0.1 10*3/uL (ref 0.0–0.1)
Eosinophils Absolute: 0.2 10*3/uL (ref 0.0–0.5)
Eosinophils Relative: 2 %
HCT: 38.7 % (ref 36.0–46.0)
Hemoglobin: 12.3 g/dL (ref 12.0–15.0)
Immature Granulocytes: 0 %
Lymphocytes Relative: 18 %
Lymphs Abs: 1.6 10*3/uL (ref 0.7–4.0)
MCH: 30.7 pg (ref 26.0–34.0)
MCHC: 31.8 g/dL (ref 30.0–36.0)
MCV: 96.5 fL (ref 80.0–100.0)
MONOS PCT: 9 %
Monocytes Absolute: 0.8 10*3/uL (ref 0.1–1.0)
Neutro Abs: 6.6 10*3/uL (ref 1.7–7.7)
Neutrophils Relative %: 70 %
Platelets: 214 10*3/uL (ref 150–400)
RBC: 4.01 MIL/uL (ref 3.87–5.11)
RDW: 12.9 % (ref 11.5–15.5)
WBC: 9.3 10*3/uL (ref 4.0–10.5)
nRBC: 0 % (ref 0.0–0.2)

## 2018-04-01 LAB — COMPREHENSIVE METABOLIC PANEL
ALT: 19 U/L (ref 0–44)
AST: 19 U/L (ref 15–41)
Albumin: 4 g/dL (ref 3.5–5.0)
Alkaline Phosphatase: 52 U/L (ref 38–126)
Anion gap: 9 (ref 5–15)
BUN: 14 mg/dL (ref 6–20)
CO2: 25 mmol/L (ref 22–32)
CREATININE: 0.9 mg/dL (ref 0.44–1.00)
Calcium: 9 mg/dL (ref 8.9–10.3)
Chloride: 103 mmol/L (ref 98–111)
GFR calc Af Amer: >60 mL/min (ref >60)
Glucose, Bld: 94 mg/dL (ref 70–99)
Potassium: 4.1 mmol/L (ref 3.5–5.1)
Sodium: 137 mmol/L (ref 135–145)
Total Bilirubin: 0.5 mg/dL (ref 0.3–1.2)
Total Protein: 7.3 g/dL (ref 6.5–8.1)

## 2018-04-01 MED ORDER — ONDANSETRON HCL 4 MG PO TABS: 4.0000 mg | ORAL_TABLET | Freq: Four times a day (QID) | ORAL | Status: DC | PRN

## 2018-04-01 MED ORDER — SODIUM CHLORIDE 0.9 % IV SOLN
INTRAVENOUS | Status: AC
  Administered 2018-04-01: 22:00:00 via INTRAVENOUS

## 2018-04-01 MED ORDER — KETOROLAC TROMETHAMINE 60 MG/2ML IM SOLN: 60.0000 mg | Freq: Once | INTRAMUSCULAR | Status: DC

## 2018-04-01 MED ORDER — HYDROCODONE-ACETAMINOPHEN 5-325 MG PO TABS
1.0000 | ORAL_TABLET | Freq: Once | ORAL | Status: AC
  Administered 2018-04-01: 1 via ORAL
  Filled 2018-04-01: qty 1

## 2018-04-01 MED ORDER — ONDANSETRON HCL 4 MG/2ML IJ SOLN: 4.0000 mg | Freq: Four times a day (QID) | INTRAMUSCULAR | Status: DC | PRN

## 2018-04-01 MED ORDER — HYDROCODONE-ACETAMINOPHEN 7.5-325 MG PO TABS: 1.0000 | ORAL_TABLET | Freq: Four times a day (QID) | ORAL | Status: DC | PRN

## 2018-04-01 MED ORDER — ACETAMINOPHEN 650 MG RE SUPP: 650.0000 mg | Freq: Four times a day (QID) | RECTAL | Status: DC | PRN

## 2018-04-01 MED ORDER — AMOXICILLIN-POT CLAVULANATE 875-125 MG PO TABS: 1.0000 | ORAL_TABLET | Freq: Two times a day (BID) | ORAL | 0 refills | Status: DC

## 2018-04-01 MED ORDER — ACETAMINOPHEN 325 MG PO TABS
650.0000 mg | ORAL_TABLET | Freq: Four times a day (QID) | ORAL | Status: DC | PRN
  Administered 2018-04-02 – 2018-04-03 (×2): 650 mg via ORAL
  Filled 2018-04-01 (×2): qty 2

## 2018-04-01 MED ORDER — IOPAMIDOL (ISOVUE-300) INJECTION 61%
100.0000 mL | Freq: Once | INTRAVENOUS | Status: AC | PRN
  Administered 2018-04-01: 100 mL via INTRAVENOUS

## 2018-04-01 MED ORDER — KETOROLAC TROMETHAMINE 60 MG/2ML IM SOLN
60.0000 mg | Freq: Once | INTRAMUSCULAR | Status: AC
  Administered 2018-04-01: 60 mg via INTRAMUSCULAR

## 2018-04-01 MED ORDER — VANCOMYCIN HCL IN DEXTROSE 1-5 GM/200ML-% IV SOLN
1000.0000 mg | Freq: Once | INTRAVENOUS | Status: AC
  Administered 2018-04-01: 1000 mg via INTRAVENOUS
  Filled 2018-04-01: qty 200

## 2018-04-01 NOTE — Plan of Care (Signed)
52 year old healthy female presents with 6 days history of growing abscess had to be drained by her primary care provider impact started on Augmentin but continued to have ongoing redness swelling and presented back to provider today at that point she was sent to emergency department CT showed no evidence of abscess but extensive cellulitis she was started on vancomycin plan to admit.  Accepted to MedSurg bed as observation pending bed availability Jamauri Kruzel 6:38 PM

## 2018-04-01 NOTE — ED Provider Notes (Signed)
[facility] EMERGENCY DEPARTMENT Provider Note   CSN: [id number] Arrival date & time: 04/01/18  1216     History   Chief Complaint Chief Complaint  Patient presents with  . Abscess    HPI Derriana Oser is a 52 y.o. female.  Jniya Madara is a 52 y.o. female with a history of hepatic steatosis, hyperlipidemia and urinary tract infections, who presents to the emergency department for evaluation of left groin abscess. Patient was sent by her primary care doctor due to concern for worsening, initial I&D performed on 12/18 by PCP, packing was placed and the patient was started on Augmentin taking regularly, but reports worsening of surrounding redness and induration she feels like another abscess has started to form in the same area.  She has had spreading redness and worsening pain.  She presented to her PCP today for packing removal and he remove packing and attempted to do further debridement but patient did not tolerate this well and due to concern for worsening infection despite antibiotics and drainage she was sent for further evaluation.  She has not had any fevers or chills, no nausea or vomiting.  But does report worsening pain despite drainage.  She is continued to have some drainage over the packing but this has decreased.  No history of diabetes or previous issues with abscesses.      Past Medical History:  Diagnosis Date  . Hepatic steatosis    MRI 01/2016  . Hyperlipidemia   . Sciatic pain   . Urinary tract infection     Patient Active Problem List   Diagnosis Date Noted  . Abscess of perineum 03/30/2018  . 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 Hyperlipidemia 12/04/2016  . Liver mass, right lobe 12/04/2016    Past Surgical History:  Procedure Laterality Date  . ABDOMINAL HYSTERECTOMY    . AUGMENTATION MAMMAPLASTY Bilateral 2014  . TONSILLECTOMY       OB History   No obstetric history on file.      Home Medications    Prior to  Admission medications   Medication Sig Start Date End Date Taking? Authorizing Provider  amoxicillin-clavulanate (AUGMENTIN) 875-125 MG tablet Take 1 tablet by mouth 2 (two) times daily. 04/01/18   Mliss Sax, MD  HYDROcodone-acetaminophen (NORCO) 7.5-325 MG tablet Take 1 tablet by mouth every 6 (six) hours as needed for moderate pain. 03/30/18   Mliss Sax, MD  Multiple Vitamin (MULTI-VITAMINS) TABS Take 1 tablet by mouth daily.    [provider]    Family History Family History  Problem Relation Age of Onset  . Hyperlipidemia Mother   . Heart disease Mother   . Hypertension Mother   . Stroke Father   . Diabetes Father   . Heart disease Brother 44       MI    Social History Social History   Tobacco Use  . Smoking status: Former Games developer  . Smokeless tobacco: Never Used  Substance Use Topics  . Alcohol use: Yes  . Drug use: No     Allergies   Patient has no known allergies.   Review of Systems Review of Systems  Constitutional: Negative for chills and fever.  HENT: Negative for congestion, rhinorrhea and sore throat.   Respiratory: Negative for cough and shortness of breath.   Cardiovascular: Negative for chest pain.  Gastrointestinal: Negative for abdominal pain, nausea and vomiting.  Genitourinary: Negative for dysuria, frequency, vaginal bleeding and vaginal discharge.  Skin: Positive for color  change and wound.  Neurological: Negative for weakness and numbness.  All other systems reviewed and are negative.    Physical Exam Updated Vital Signs BP 136/81 (BP Location: Right Arm)   Pulse (!) 58   Temp 98.2 F (36.8 C) (Oral)   Resp 18   Ht 5\' 6"  (1.676 m)   Wt 73 kg   SpO2 98%   BMI 25.99 kg/m   Physical Exam Vitals signs and nursing note reviewed.  Constitutional:      General: She is not in acute distress.    Appearance: Normal appearance. She is well-developed and normal weight. She is not ill-appearing or  diaphoretic.  HENT:     Head: Normocephalic and atraumatic.     Mouth/Throat:     Mouth: Mucous membranes are moist.     Pharynx: Oropharynx is clear.  Eyes:     General:        Right eye: No discharge.        Left eye: No discharge.  Neck:     Musculoskeletal: Neck supple.  Cardiovascular:     Rate and Rhythm: Normal rate and regular rhythm.     Pulses: Normal pulses.     Heart sounds: Normal heart sounds.  Pulmonary:     Effort: Pulmonary effort is normal. No respiratory distress.     Breath sounds: Normal breath sounds.  Abdominal:     General: Abdomen is flat. Bowel sounds are normal. There is no distension.     Palpations: Abdomen is soft. There is no mass.     Tenderness: There is no abdominal tenderness. There is no guarding.  Genitourinary:      Comments: Erythema and induration is noted above extending from the left labia back towards the left buttock without expressible drainage, this does not involve the vagina and does not track towards the rectum. Neurological:     Mental Status: She is alert and oriented to person, place, and time. Mental status is at baseline.     Coordination: Coordination normal.  Psychiatric:        Mood and Affect: Mood normal.        Behavior: Behavior normal.      ED Treatments / Results  Labs (all labs ordered are listed, but only abnormal results are displayed) Labs Reviewed  CULTURE, BLOOD (ROUTINE X 2)  CULTURE, BLOOD (ROUTINE X 2)  AEROBIC/ANAEROBIC CULTURE (SURGICAL/DEEP WOUND)  CBC WITH DIFFERENTIAL/PLATELET  COMPREHENSIVE METABOLIC PANEL    EKG None  Radiology Ct Pelvis W Contrast  Result Date: 04/01/2018 CLINICAL DATA:  52 year old female with a history of gluteal abscess, prior incision and drainage and local wound care EXAM: CT PELVIS WITH CONTRAST TECHNIQUE: Multidetector CT imaging of the pelvis was performed using the standard protocol following the bolus administration of intravenous contrast. CONTRAST:  100mL  ISOVUE-300 IOPAMIDOL (ISOVUE-300) INJECTION 61% COMPARISON:  None. FINDINGS: Urinary Tract:  Urinary bladder relatively decompressed. Bowel: Visualized aspects of small bowel unremarkable. Normal appendix is visualized. Unremarkable appearance of visualized colon with mild stool burden Vascular/Lymphatic: Minimal atherosclerotic changes of the visualized arteries. Bilateral iliac arteries and proximal femoral arteries are patent. Borderline enlarged lymph nodes of the left inguinal region. Small lymph nodes of the right inguinal region. Small lymph nodes of the left external iliac nodal stations. Reproductive: Surgical changes of hysterectomy. Follicular changes versus small cyst of the left ovary with no comparison. Other: Inflammatory changes with edema within the left gluteal soft tissues extending to the midline cleft. There are  a few small locules of gas in this region. No focal fluid to represent an abscess. Mild skin thickening. Musculoskeletal: No acute displaced fracture. IMPRESSION: Edema and inflammatory changes within the medial soft tissues of the left gluteal region extending to the midline with no focal fluid. There are a few locules of gas which are compatible with the given history of surgical intervention. Findings suggest localized cellulitis with no evidence of abscess. Reactive inguinal lymph nodes, most pronounced on the left. Left ovarian cyst or follicle measuring 2.6 cm. If the patient is premenopausal this requires no specific follow-up. If the patient is known to be postmenopausal, referral for Ob GYN evaluation and initiation of annual ultrasound surveillance is warranted. Electronically Signed   By: Gilmer Mor D.O.   On: 04/01/2018 17:06    Procedures Wound packing Date/Time: 04/01/2018 7:26 PM Performed by: Dartha Lodge, PA-C Authorized by: Dartha Lodge, PA-C  Consent: Verbal consent obtained. Risks and benefits: risks, benefits and alternatives were discussed Consent  given by: patient Patient understanding: patient states understanding of the procedure being performed Patient identity confirmed: verbally with patient Local anesthesia used: no  Anesthesia: Local anesthesia used: no  Sedation: Patient sedated: no  Patient tolerance: Patient tolerated the procedure well with no immediate complications    (including critical care time)  Medications Ordered in ED Medications  vancomycin (VANCOCIN) IVPB 1000 mg/200 mL premix (has no administration in time range)  iopamidol (ISOVUE-300) 61 % injection 100 mL (100 mLs Intravenous Contrast Given 04/01/18 1643)  HYDROcodone-acetaminophen (NORCO/VICODIN) 5-325 MG per tablet 1 tablet (1 tablet Oral Given 04/01/18 1914)     Initial Impression / Assessment and Plan / ED Course  I have reviewed the triage vital signs and the nursing notes.  Pertinent labs & imaging results that were available during my care of the patient were reviewed by me and considered in my medical decision making (see chart for details).  Patient presents from primary care doctor for evaluation of worsening left groin abscess, I&D to the area 2 days ago and packing has been in place patient was been on Augmentin and has had spreading redness worsening pain and increasing induration.  On exam it does not appear to track towards the rectum or involve the vagina, packing was removed without expressible drainage but patient has significant amount of erythema and induration in the area of tracking back across the buttock, will get CT scan to evaluate for deeper space abscess will get lab work as well.  Lab work is reassuring with no leukocytosis, normal hemoglobin and no acute electrolyte derangements, no hyperglycemia to suggest unknown diabetes as cause for patient's abscess and cellulitis.  CT scan shows edema and inflammatory changes consistent with cellulitis throughout the left gluteal region and perineum extending towards the midline  without a focal fluid collection.  There are a few locules of gas present which is consistent with history of recent incision and drainage.  There is reactive inguinal lymphadenopathy.  2.6 cm ovarian cyst noted which will need OB/GYN follow-up non-emergently.  Given amount of cellulitis present that patient has had 2 visits to PCP with worsening of redness despite Augmentin feel patient would benefit from admission for IV antibiotics, will collect blood cultures and start patient on vancomycin.  Abscess was repacked which was tolerated well by the patient.  [facility] hospital[facility] consulted for admission.  6:28 PM Case discussed with Dr. Adela Glimpse with [facility] hospitalist who will see and admit the patient and [facility] the [facility] in  [facility].  Final Clinical Impressions(s) / ED Diagnoses   Final diagnoses:  Abscess of left groin    ED Discharge Orders    None       Dartha LodgeFord, Piper Hassebrock N, [place] JerseyPA-C 04/01/18 1927    Rolan BuccoBelfi, Melanie, MD 04/01/18 2125

## 2018-04-01 NOTE — Addendum Note (Signed)
Addended by: Andrez GrimeKREMER, Ona Roehrs A on: 04/01/2018 11:43 AM   Modules accepted: Orders

## 2018-04-01 NOTE — ED Triage Notes (Signed)
C/o left groin abscess x 6 days[facility]seen today and sent to ED-pt states she was told she needed "general surgery"-NAD-steady gait

## 2018-04-01 NOTE — H&P (Signed)
Erica PallKalina Bonawitz WUJ:[id number] DOB: 12/26/1965 DOA: 04/01/2018     PCP: Mliss SaxKremer, William Alfred, MD   Outpatient Specialists:  none    Patient arrived to ER on 04/01/18 at 1216  Patient coming from: home Lives   With family   Chief Complaint:  Chief Complaint  Patient presents with  . Abscess    HPI: Erica Dickson is a 52 y.o. female with medical history significant of HLD, steatosis    Presented with   6 days history of growing abscess had to be drained by her primary care provider and packed on 18th of  December started on Augmentin but continued to have ongoing redness swelling and presented back to provider today packing was removed attempted again at drainage but no fluid came at that point she was sent to emergency department. Was no fevers or chills no nausea no vomiting no prior history of cellulitis or abscess she has continued to have some drainage over the packing but has decreased.  No prior history of diabetes no history of HIV Patient occasionally shaves and has been doing some [place] Waxing recently she feels an ingrown hair likely started the abscess.    While in ER: CT showed no evidence of abscess but extensive cellulitis she was started on vancomycin plan to admit. The following Work up has been ordered so far:  Orders Placed This Encounter  Procedures  . Wound packing  . Blood culture (routine x 2)  . Aerobic/Anaerobic Culture (surgical/deep wound)  . CT PELVIS W CONTRAST  . CBC with Differential  . Comprehensive metabolic panel  . Consult to hospitalist  . Place in observation (patient's expected length of stay will be less than 2 midnights)     Following Medications were ordered in ER: Medications  iopamidol (ISOVUE-300) 61 % injection 100 mL (100 mLs Intravenous Contrast Given 04/01/18 1643)  vancomycin (VANCOCIN) IVPB 1000 mg/200 mL premix (1,000 mg Intravenous New Bag/Given 04/01/18 1943)  HYDROcodone-acetaminophen (NORCO/VICODIN) 5-325 MG per  tablet 1 tablet (1 tablet Oral Given 04/01/18 1914)    Significant initial  Findings: Abnormal Labs Reviewed - No abnormal labs to display   Lactic Acid, Venous No results found for: LATICACIDVEN  Na 137 K 4.1  Cr   stable,    Lab Results  Component Value Date   CREATININE 0.90 04/01/2018   CREATININE 0.93 02/10/2018   CREATININE 0.82 12/09/2016      WBC  9.3  HG/HCT  stable,       Component Value Date/Time   HGB 12.3 04/01/2018 1505   HCT 38.7 04/01/2018 1505       UA not ordered   CTabd/pelvis -no focal fluid.  Cellulitis  ECG:  Not obtained    ED Triage Vitals  Enc Vitals Group     BP 04/01/18 1227 (!) 125/91     Pulse Rate 04/01/18 1227 70     Resp 04/01/18 1227 18     Temp 04/01/18 1227 98.2 F (36.8 C)     Temp Source 04/01/18 1227 Oral     SpO2 04/01/18 1227 98 %     Weight 04/01/18 1225 161 lb (73 kg)     Height 04/01/18 1225 5\' 6"  (1.676 m)     Head Circumference --      Peak Flow --      Pain Score 04/01/18 1230 2     Pain Loc --      Pain Edu? --      Excl. in  GC? --   ZOXW(96)@       Latest  Blood pressure 123/81, pulse 73, temperature 98.2 F (36.8 C), temperature source Oral, resp. rate 17, height 5\' 6"  (1.676 m), weight 73 kg, SpO2 99 %.     Hospitalist was called for admission for cellulitis   Review of Systems:     Pertinent positives include:  Chills, swelling, pain   Constitutional:  No weight loss, night sweats, Fevers, fatigue, weight loss  HEENT:  No headaches, Difficulty swallowing,Tooth/dental problems,Sore throat,  No sneezing, itching, ear ache, nasal congestion, post nasal drip,  Cardio-vascular:  No chest pain, Orthopnea, PND, anasarca, dizziness, palpitations. no Bilateral lower extremity swelling  GI:  No heartburn, indigestion, abdominal pain, nausea, vomiting, diarrhea, change in bowel habits, loss of appetite, melena, blood in stool, hematemesis Resp:  no shortness of breath at rest. No dyspnea on  exertion, No excess mucus, no productive cough, No non-productive cough, No coughing up of blood.No change in color of mucus.No wheezing. Skin:  no rash or lesions. No jaundice GU:  no dysuria, change in color of urine, no urgency or frequency. No straining to urinate.  No flank pain.  Musculoskeletal:  No joint pain or no joint swelling. No decreased range of motion. No back pain.  Psych:  No change in mood or affect. No depression or anxiety. No memory loss.  Neuro: no localizing neurological complaints, no tingling, no weakness, no double vision, no gait abnormality, no slurred speech, no confusion  All systems reviewed and apart from HOPI all are negative  Past Medical History:   Past Medical History:  Diagnosis Date  . Hepatic steatosis    MRI 01/2016  . Hyperlipidemia   . Sciatic pain   . Urinary tract infection       Past Surgical History:  Procedure Laterality Date  . ABDOMINAL HYSTERECTOMY    . AUGMENTATION MAMMAPLASTY Bilateral 2014  . TONSILLECTOMY      Social History:  Ambulatory   independently      reports that she has quit smoking. She has never used smokeless tobacco. She reports current alcohol use. She reports that she does not use drugs.   Family History:   Family History  Problem Relation Age of Onset  . Hyperlipidemia Mother   . Heart disease Mother   . Hypertension Mother   . Stroke Father   . Diabetes Father   . Heart disease Brother 104       MI    Allergies: No Known Allergies   Prior to Admission medications   Medication Sig Start Date End Date Taking? Authorizing Provider  amoxicillin-clavulanate (AUGMENTIN) 875-125 MG tablet Take 1 tablet by mouth 2 (two) times daily. 04/01/18   Mliss Sax, MD  HYDROcodone-acetaminophen (NORCO) 7.5-325 MG tablet Take 1 tablet by mouth every 6 (six) hours as needed for moderate pain. 03/30/18   Mliss Sax, MD  Multiple Vitamin (MULTI-VITAMINS) TABS Take 1 tablet by mouth  daily.    [provider]   Physical Exam: Blood pressure 123/81, pulse 73, temperature 98.2 F (36.8 C), temperature source Oral, resp. rate 17, height 5\' 6"  (1.676 m), weight 73 kg, SpO2 99 %. 1. General:  in No Acute distress  well  -appearing 2. Psychological: Alert and Oriented 3. Head/ENT:     Dry Mucous Membranes                          Head Non traumatic,  neck supple                         Poor Dentition 4. SKIN: decreased Skin turgor,  Skin clean Dry and intact no rash 5. Heart: Regular rate and rhythm no  Murmur, no Rub or gallop 6. Lungs:  no wheezes or crackles   7. Abdomen: Soft,  non-tender, Non distended    bowel sounds present 8. Lower extremities: no clubbing, cyanosis, or  edema 9. Neurologically Grossly intact, moving all 4 extremities equally  10. MSK: Normal range of motion   LABS:     Recent Labs  Lab 04/01/18 1505  WBC 9.3  NEUTROABS 6.6  HGB 12.3  HCT 38.7  MCV 96.5  PLT 214   Basic Metabolic Panel: Recent Labs  Lab 04/01/18 1505  NA 137  K 4.1  CL 103  CO2 25  GLUCOSE 94  BUN 14  CREATININE 0.90  CALCIUM 9.0      Recent Labs  Lab 04/01/18 1505  AST 19  ALT 19  ALKPHOS 52  BILITOT 0.5  PROT 7.3  ALBUMIN 4.0   No results for input(s): LIPASE, AMYLASE in the last 168 hours. No results for input(s): AMMONIA in the last 168 hours.    HbA1C: No results for input(s): HGBA1C in the last 72 hours. CBG: No results for input(s): GLUCAP in the last 168 hours.    Urine analysis:    Component Value Date/Time   COLORURINE YELLOW 02/10/2018 0849   APPEARANCEUR CLEAR 02/10/2018 0849   LABSPEC 1.010 02/10/2018 0849   PHURINE 6.5 02/10/2018 0849   GLUCOSEU NEGATIVE 02/10/2018 0849   HGBUR NEGATIVE 02/10/2018 0849   KETONESUR NEGATIVE 02/10/2018 0849   PROTEINUR NEGATIVE 02/10/2018 0849   NITRITE NEGATIVE 02/10/2018 0849   LEUKOCYTESUR TRACE (A) 02/10/2018 0849      Cultures: No results found for: SDES, SPECREQUEST,  CULT, REPTSTATUS   Radiological Exams on Admission: Ct Pelvis W Contrast  Result Date: 04/01/2018 CLINICAL DATA:  52 year old female with a history of gluteal abscess, prior incision and drainage and local wound care EXAM: CT PELVIS WITH CONTRAST TECHNIQUE: Multidetector CT imaging of the pelvis was performed using the standard protocol following the bolus administration of intravenous contrast. CONTRAST:  100mL ISOVUE-300 IOPAMIDOL (ISOVUE-300) INJECTION 61% COMPARISON:  None. FINDINGS: Urinary Tract:  Urinary bladder relatively decompressed. Bowel: Visualized aspects of small bowel unremarkable. Normal appendix is visualized. Unremarkable appearance of visualized colon with mild stool burden Vascular/Lymphatic: Minimal atherosclerotic changes of the visualized arteries. Bilateral iliac arteries and proximal femoral arteries are patent. Borderline enlarged lymph nodes of the left inguinal region. Small lymph nodes of the right inguinal region. Small lymph nodes of the left external iliac nodal stations. Reproductive: Surgical changes of hysterectomy. Follicular changes versus small cyst of the left ovary with no comparison. Other: Inflammatory changes with edema within the left gluteal soft tissues extending to the midline cleft. There are a few small locules of gas in this region. No focal fluid to represent an abscess. Mild skin thickening. Musculoskeletal: No acute displaced fracture. IMPRESSION: Edema and inflammatory changes within the medial soft tissues of the left gluteal region extending to the midline with no focal fluid. There are a few locules of gas which are compatible with the given history of surgical intervention. Findings suggest localized cellulitis with no evidence of abscess. Reactive inguinal lymph nodes, most pronounced on the left. Left ovarian cyst or follicle measuring 2.6 cm. If the patient  is premenopausal this requires no specific follow-up. If the patient is known to be  postmenopausal, referral for Ob GYN evaluation and initiation of annual ultrasound surveillance is warranted. Electronically Signed   By: Gilmer Mor D.O.   On: 04/01/2018 17:06    Chart has been reviewed    Assessment/Plan   52 y.o. female with medical history significant of HLD, steatosis     Admitted for cellulitis  Present on Admission: . Cellulitis - -admit per cellulitis protocol will       on vancomycin given purulent discharge,      CT  showed:   no evidence of osteomyelitis  no foreign   objects     Will obtain MRSA screening,      obtain blood cultures if febrile or septic     further antibiotic adjustment pending above results  . Ovarian cyst - still premanaousal but has no period sp hysterectomy will need to follow-up as an outpatient  . Hyperlipidemia-stable need to follow-up as an outpatient   Other plan as per orders.  DVT prophylaxis:  SCD      Code Status:  FULL CODE as per patient   I had personally discussed CODE STATUS with patient    Family Communication:   Family not at  Bedside     Disposition Plan:                        Admission status:   Obs    Level of care    medical floor      Therisa Doyne 04/01/2018, 10:53 PM    [facility]  Pager [phone]   after 2 AM please page floor coverage PA If 7AM-7PM, please contact the day team taking care of the patient  [email or web address]  Password [email or web address]

## 2018-04-01 NOTE — Addendum Note (Signed)
Addended by: Marcell AngerSELF, Sriya Kroeze E on: 04/01/2018 11:30 AM   Modules accepted: Orders

## 2018-04-02 DIAGNOSIS — L02214 Cutaneous abscess of groin: Secondary | ICD-10-CM | POA: Diagnosis not present

## 2018-04-02 LAB — CBC
HCT: 37.2 % (ref 36.0–46.0)
Hemoglobin: 12.1 g/dL (ref 12.0–15.0)
MCH: 31.3 pg (ref 26.0–34.0)
MCHC: 32.5 g/dL (ref 30.0–36.0)
MCV: 96.1 fL (ref 80.0–100.0)
Platelets: 229 10*3/uL (ref 150–400)
RBC: 3.87 MIL/uL (ref 3.87–5.11)
RDW: 12.9 % (ref 11.5–15.5)
WBC: 6.3 10*3/uL (ref 4.0–10.5)
nRBC: 0 % (ref 0.0–0.2)

## 2018-04-02 LAB — COMPREHENSIVE METABOLIC PANEL
ALT: 17 U/L (ref 0–44)
AST: 16 U/L (ref 15–41)
Albumin: 3.7 g/dL (ref 3.5–5.0)
Alkaline Phosphatase: 47 U/L (ref 38–126)
Anion gap: 8 (ref 5–15)
BUN: 17 mg/dL (ref 6–20)
CO2: 25 mmol/L (ref 22–32)
Calcium: 8.4 mg/dL — ABNORMAL LOW (ref 8.9–10.3)
Chloride: 104 mmol/L (ref 98–111)
Creatinine, Ser: 0.85 mg/dL (ref 0.44–1.00)
GFR calc Af Amer: >60 mL/min (ref >60)
Glucose, Bld: 96 mg/dL (ref 70–99)
Potassium: 4.1 mmol/L (ref 3.5–5.1)
Sodium: 137 mmol/L (ref 135–145)
TOTAL PROTEIN: 6.4 g/dL — AB (ref 6.5–8.1)
Total Bilirubin: 0.8 mg/dL (ref 0.3–1.2)

## 2018-04-02 LAB — PHOSPHORUS: Phosphorus: 4 mg/dL (ref 2.5–4.6)

## 2018-04-02 LAB — HIV ANTIBODY (ROUTINE TESTING W REFLEX): HIV SCREEN 4TH GENERATION: NONREACTIVE

## 2018-04-02 LAB — MRSA PCR SCREENING: MRSA by PCR: NEGATIVE

## 2018-04-02 LAB — MAGNESIUM: Magnesium: 2.5 mg/dL — ABNORMAL HIGH (ref 1.7–2.4)

## 2018-04-02 LAB — TSH: TSH: 5.235 u[IU]/mL — ABNORMAL HIGH (ref 0.350–4.500)

## 2018-04-02 MED ORDER — BACID PO TABS
2.0000 | ORAL_TABLET | Freq: Three times a day (TID) | ORAL | Status: DC
  Filled 2018-04-02 (×2): qty 2

## 2018-04-02 MED ORDER — HEPARIN SODIUM (PORCINE) 5000 UNIT/ML IJ SOLN
5000.0000 [IU] | Freq: Three times a day (TID) | INTRAMUSCULAR | Status: DC
  Administered 2018-04-02 – 2018-04-03 (×2): 5000 [IU] via SUBCUTANEOUS
  Filled 2018-04-02 (×2): qty 1

## 2018-04-02 MED ORDER — SODIUM CHLORIDE 0.9 % IV SOLN
1.5000 g | Freq: Four times a day (QID) | INTRAVENOUS | Status: DC
  Administered 2018-04-02 – 2018-04-03 (×4): 1.5 g via INTRAVENOUS
  Filled 2018-04-02 (×6): qty 1.5

## 2018-04-02 MED ORDER — SODIUM CHLORIDE 0.9 % IV SOLN
INTRAVENOUS | Status: DC
  Administered 2018-04-02: 13:00:00 via INTRAVENOUS

## 2018-04-02 MED ORDER — VANCOMYCIN HCL 10 G IV SOLR
1250.0000 mg | INTRAVENOUS | Status: AC
  Administered 2018-04-02: 1250 mg via INTRAVENOUS
  Filled 2018-04-02: qty 1250

## 2018-04-02 MED ORDER — LACTINEX PO CHEW
2.0000 | CHEWABLE_TABLET | Freq: Three times a day (TID) | ORAL | Status: DC
  Administered 2018-04-02 – 2018-04-03 (×4): 2 via ORAL
  Filled 2018-04-02 (×5): qty 2

## 2018-04-02 NOTE — Progress Notes (Signed)
Patient Demographics:    Erica Dickson, is a 52 y.o. female, DOB - 10/04/1965, WUJ:[id number]  Admit date - 04/01/2018   Admitting Physician Therisa DoyneAnastassia Doutova, MD  Outpatient Primary MD for the patient is Mliss SaxKremer, William Alfred, MD  LOS - 0   Chief Complaint  Patient presents with  . Abscess        Subjective:    Erica Dickson today has no fevers, no emesis,  No chest pain, left perineal area discomfort, no chills no urinary symptoms  Assessment  & Plan :    Principal Problem:   Abscess of left groin Active Problems:   Hyperlipidemia   Cellulitis   Ovarian cyst  Brief summary 52 year old otherwise healthy female admitted on 04/01/2018 with left perineal area abscess status post I&D by PCP couple days prior to admission   Plan:- 1)Lt Perineal/Inner Thigh Area Abscess--- status post I&D by PCP in the office on 03/30/2018, MRSA PCR negative, stop IV vancomycin okay to treat empirically with IV Unasyn pending cultures, if improves may discharge home on 04/03/2018 on Augmentin (patient took Augmentin for couple days prior to admission, she tolerated it well).... No fevers, no chills, no leukocytosis,...  2) ovarian cyst noted on abdominal imaging studies--- she is aware of this she will follow-up with PCP for surveillance with pelvic ultrasounds, patient is status post prior hysterectomy but she has not gone through menopause yet  3) borderline TSH--- clinically patient is euthyroid, repeat TSH in 4 to 6 weeks with PCP  Disposition/Need for in-Hospital Stay- patient unable to be discharged at this time due to need for IV antibiotics for abscess pending culture report  Code Status : Full  Disposition Plan  : Home on 03/14/18  DVT Prophylaxis  :    Heparin /SCD  Lab Results  Component Value Date   PLT 229 04/02/2018    Inpatient Medications  Scheduled Meds: . lactobacillus acidophilus &  bulgar  2 tablet Oral TID WC   Continuous Infusions: . sodium chloride 75 mL/hr at 04/02/18 1245  . ampicillin-sulbactam (UNASYN) IV 1.5 g (04/02/18 1433)   PRN Meds:.acetaminophen **OR** acetaminophen, HYDROcodone-acetaminophen, ondansetron **OR** ondansetron (ZOFRAN) IV    Anti-infectives (From admission, onward)   Start     Dose/Rate Route Frequency Ordered Stop   04/02/18 1400  ampicillin-sulbactam (UNASYN) 1.5 g in sodium chloride 0.9 % 100 mL IVPB     1.5 g 200 mL/hr over 30 Minutes Intravenous Every 6 hours 04/02/18 0856     04/02/18 1000  vancomycin (VANCOCIN) 1,250 mg in sodium chloride 0.9 % 250 mL IVPB     1,250 mg 166.7 mL/hr over 90 Minutes Intravenous Every 24 hours 04/02/18 0343 04/02/18 1144   04/01/18 1745  vancomycin (VANCOCIN) IVPB 1000 mg/200 mL premix     1,000 mg 200 mL/hr over 60 Minutes Intravenous  Once 04/01/18 1736 04/01/18 2043        Objective:   Vitals:   04/01/18 1937 04/01/18 2140 04/02/18 0533 04/02/18 1540  BP: 123/81 121/82 115/69 140/88  Pulse: 73 64 60 (!) 59  Resp: 17 16 16    Temp:  98 F (36.7 C) 97.9 F (36.6 C) 98.4 F (36.9 C)  TempSrc:  Oral Oral Oral  SpO2: 99% 99% 96% 99%  Weight:      Height:        Wt Readings from Last 3 Encounters:  04/01/18 73 kg  04/01/18 73.5 kg  03/30/18 73.5 kg     Intake/Output Summary (Last 24 hours) at 04/02/2018 1749 Last data filed at 04/02/2018 1525 Gross per 24 hour  Intake 2552.65 ml  Output 2000 ml  Net 552.65 ml     Physical Exam Patient is examined daily including today on 04/02/18 , exams remain the same as of yesterday except that has changed   Gen:- Awake Alert,  In no apparent distress  HEENT:- Erica Dickson.AT, No sclera icterus Neck-Supple Neck,No JVD,.  Lungs-  CTAB , fair symmetrical air movement CV- S1, S2 normal, regular  Abd-  +ve B.Sounds, Abd Soft, No tenderness,    Extremity- No  edema, pedal pulses present  Psych-affect is appropriate, oriented x3 Neuro-no new  focal deficits, no tremors Skin- Erythema, warmt, tenderness, swelling and induration is noted  extending from the left labia back towards the left buttock ,  this does not involve the vagina and does not track towards the rectum, Iodoform gauze packing noted---- RN Colin Mulders present throughout exam and RN assisted with taking photos with patient's permission--- Media Information   Document Information   Photos    04/02/2018 08:33  Attached To:  Hospital Encounter on 04/01/18  Source Information   Shon Hale, MD  [facility]   Media Information   Document Information   Photos    04/02/2018 08:34  Attached To:  Hospital Encounter on 04/01/18  Source Information   Shon Hale, MD  [facility]     Data Review:   Micro Results Recent Results (from the past 240 hour(s))  Blood culture (routine x 2)     Status: None (Preliminary result)   Collection Time: 04/01/18  7:30 PM  Result Value Ref Range Status   Specimen Description   Final    RIGHT ANTECUBITAL Performed at [facility], [address] [place], [place] [address]    Special Requests   Final    BOTTLES DRAWN AEROBIC ONLY Blood Culture adequate volume Performed at [facility], [address] [place], [place] [address]    Culture   Final    NO GROWTH < 12 HOURS Performed at [facility], [address] [place], [place] [address]    Report Status PENDING  Incomplete  MRSA PCR Screening     Status: None   Collection Time: 04/01/18 10:19 PM  Result Value Ref Range Status   MRSA by PCR NEGATIVE NEGATIVE Final    Comment:        The GeneXpert MRSA Assay (FDA approved for NASAL specimens only), is one component of a comprehensive MRSA colonization surveillance program. It is not intended to diagnose MRSA infection nor to guide or monitor treatment for MRSA infections. Performed at [facility], [address], [place], [place]  [address]     Radiology Reports Ct Pelvis W Contrast  Result Date: 04/01/2018 CLINICAL DATA:  52 year old female with a history of gluteal abscess, prior incision and drainage and local wound care EXAM: CT PELVIS WITH CONTRAST TECHNIQUE: Multidetector CT imaging of the pelvis was performed using the standard protocol following the bolus administration of intravenous contrast. CONTRAST:  ISOVUE-300 IOPAMIDOL (ISOVUE-300) INJECTION 61% COMPARISON:  None. FINDINGS: Urinary Tract:  Urinary bladder relatively decompressed. Bowel: Visualized aspects of small bowel unremarkable. Normal  appendix is visualized. Unremarkable appearance of visualized colon with mild stool burden Vascular/Lymphatic: Minimal atherosclerotic changes of the visualized arteries. Bilateral iliac arteries and proximal femoral arteries are patent. Borderline enlarged lymph nodes of the left inguinal region. Small lymph nodes of the right inguinal region. Small lymph nodes of the left external iliac nodal stations. Reproductive: Surgical changes of hysterectomy. Follicular changes versus small cyst of the left ovary with no comparison. Other: Inflammatory changes with edema within the left gluteal soft tissues extending to the midline cleft. There are a few small locules of gas in this region. No focal fluid to represent an abscess. Mild skin thickening. Musculoskeletal: No acute displaced fracture. IMPRESSION: Edema and inflammatory changes within the medial soft tissues of the left gluteal region extending to the midline with no focal fluid. There are a few locules of gas which are compatible with the given history of surgical intervention. Findings suggest localized cellulitis with no evidence of abscess. Reactive inguinal lymph nodes, most pronounced on the left. Left ovarian cyst or follicle measuring 2.6 cm. If the patient is premenopausal this requires no specific follow-up. If the patient is known to be postmenopausal, referral for Ob  GYN evaluation and initiation of annual ultrasound surveillance is warranted. Electronically Signed   By: Gilmer MorJaime  Wagner D.O.   On: 04/01/2018 17:06     CBC Recent Labs  Lab 04/01/18 1505 04/02/18 0414  WBC 9.3 6.3  HGB 12.3 12.1  HCT 38.7 37.2  PLT 214 229  MCV 96.5 96.1  MCH 30.7 31.3  MCHC 31.8 32.5  RDW 12.9 12.9  LYMPHSABS 1.6  --   MONOABS 0.8  --   EOSABS 0.2  --   BASOSABS 0.1  --     Chemistries  Recent Labs  Lab 04/01/18 1505 04/02/18 0414  NA 137 137  K 4.1 4.1  CL 103 104  CO2 25 25  GLUCOSE 94 96  BUN 14 17  CREATININE 0.90 0.85  CALCIUM 9.0 8.4*  MG  --  2.5*  AST 19 16  ALT 19 17  ALKPHOS 52 47  BILITOT 0.5 0.8   ------------------------------------------------------------------------------------------------------------------   Lab Results  Component Value Date   HGBA1C 5.2 03/18/2016   ------------------------------------------------------------------------------------------------------------------ Recent Labs    04/02/18 0414  TSH 5.235*     Shon Haleourage Mohammed Mcandrew M.D on 04/02/2018 at 5:49 PM  Pager---[phone] Go to [email or web address] - password [email or web address] for contact info  [facility] - Office  [phone]

## 2018-04-02 NOTE — Progress Notes (Signed)
Pharmacy Antibiotic Note  Erica PallKalina Dickson is a 52 y.o. female admitted on 04/01/2018 with Cellulitis.  Pharmacy has been consulted for Vancomycin dosing.  Plan: Vancomycin 1gm iv x1, then 1250mg  iv q24hr  Goal AUC = 400 - 500 for all indications, except meningitis (goal AUC > 500 and Cmin 15-20 mcg/mL)   Height: 5\' 6"  (167.6 cm) Weight: 161 lb (73 kg) IBW/kg (Calculated) : 59.3  Temp (24hrs), Avg:98.1 F (36.7 C), Min:98 F (36.7 C), Max:98.2 F (36.8 C)  Recent Labs  Lab 04/01/18 1505  WBC 9.3  CREATININE 0.90    Estimated Creatinine Clearance: 74.8 mL/min (by C-G formula based on SCr of 0.9 mg/dL).    No Known Allergies  Antimicrobials this admission: Vancomycin 04/02/2018 >>   Dose adjustments this admission: -  Microbiology results: -  Thank you for allowing pharmacy to be a part of this patient's care.  Erica DavidsonGrimsley Dickson, Erica Dickson 04/02/2018 3:43 AM

## 2018-04-03 DIAGNOSIS — L02214 Cutaneous abscess of groin: Secondary | ICD-10-CM | POA: Diagnosis not present

## 2018-04-03 LAB — BASIC METABOLIC PANEL
Anion gap: 8 (ref 5–15)
BUN: 16 mg/dL (ref 6–20)
CO2: 25 mmol/L (ref 22–32)
Calcium: 8.5 mg/dL — ABNORMAL LOW (ref 8.9–10.3)
Chloride: 108 mmol/L (ref 98–111)
Creatinine, Ser: 0.74 mg/dL (ref 0.44–1.00)
GFR calc Af Amer: >60 mL/min (ref >60)
GFR calc non Af Amer: >60 mL/min (ref >60)
Glucose, Bld: 99 mg/dL (ref 70–99)
POTASSIUM: 3.9 mmol/L (ref 3.5–5.1)
SODIUM: 141 mmol/L (ref 135–145)

## 2018-04-03 MED ORDER — AMOXICILLIN-POT CLAVULANATE 875-125 MG PO TABS: 1.0000 | ORAL_TABLET | Freq: Two times a day (BID) | ORAL | 0 refills | Status: DC

## 2018-04-03 MED ORDER — DOXYCYCLINE HYCLATE 100 MG PO TABS: 100.0000 mg | ORAL_TABLET | Freq: Two times a day (BID) | ORAL | 0 refills | Status: DC

## 2018-04-03 NOTE — Discharge Summary (Signed)
PATIENT DETAILS Name: Erica Dickson Age: 52 y.o. Sex: female Date of Birth: 06-Dec-1965 MRN: [id number]. Admitting Physician: Therisa Doyne, MD WUJ:[id number], Talmadge Coventry, MD  Admit Date: 04/01/2018 Discharge date: 04/03/2018  Recommendations for Outpatient Follow-up:  1. Follow up with PCP in 2-3 days  2. Repeat TSH in 3 months 3. Has an ovarian cyst-further work-up deferred to the outpatient setting  Admitted From:  Home  Disposition: Home with home health services   Home Health: Yes  Equipment/Devices: None  Discharge Condition: Stable  CODE STATUS: FULL CODE  Diet recommendation:  Regular   Brief Summary: See H&P, Labs, Consult and Test reports for all details in brief, 52 year old otherwise healthy female admitted on 04/01/2018 with left perineal area abscess status post I&D by PCP couple days prior to admission-admitted to the hospitalist service due to concern for worsening of the left thigh area due to ongoing infection in spite of being on oral antimicrobial therapy.  Brief Hospital Course: 1)Lt Perineal/Inner Thigh Area Abscess:status post I&D by PCP in the office on 03/30/2018, MRSA PCR negative,  initially on IV vancomycin-subsequently transition to Unasyn.  Wound on day of discharge with some mild induration but without any fluctuant area-packing still in place.  CT scan of the abdomen/pelvis on admission did not show any abscess.  Since improved-afebrile-we will transition to doxycycline and Augmentin for 5 more days.  Have asked RN to see if family member can help her with some wound care, have ordered home health RN services for wound care as well.  Patient instructed to follow with PCP in the next 2-3 days.  2) ovarian cyst noted on abdominal imaging studies: she is aware of this she will follow-up with PCP for surveillance with pelvic ultrasounds, patient is status post prior hysterectomy but she has not gone through menopause yet  3) borderline  TSH: clinically patient is euthyroid, repeat TSH in 4 to 6 weeks   Procedures/Studies: None  Discharge Diagnoses:  Principal Problem:   Abscess of left groin Active Problems:   Hyperlipidemia   Cellulitis   Ovarian cyst   Discharge Instructions:  Activity:  As tolerated  Discharge Instructions    Call MD for:  redness, tenderness, or signs of infection (pain, swelling, redness, odor or green/yellow discharge around incision site)   Complete by:  As directed    Call MD for:  severe uncontrolled pain   Complete by:  As directed    Diet general   Complete by:  As directed    Discharge instructions   Complete by:  As directed    Follow with Primary MD  Mliss Sax, MD in 2-3 days  Please get a complete blood count and chemistry panel checked by your Primary MD at your next visit, and again as instructed by your Primary MD.  Get Medicines reviewed and adjusted: Please take all your medications with you for your next visit with your Primary MD  Laboratory/radiological data: Please request your Primary MD to go over all hospital tests and procedure/radiological results at the follow up, please ask your Primary MD to get all Hospital records sent to his/her office.  In some cases, they will be blood work, cultures and biopsy results pending at the time of your discharge. Please request that your primary care M.D. follows up on these results.  Also Note the following: If you experience worsening of your admission symptoms, develop shortness of breath, life threatening emergency, suicidal or homicidal thoughts you must seek medical attention immediately  by calling 911 or calling your MD immediately  if symptoms less severe.  You must read complete instructions/literature along with all the possible adverse reactions/side effects for all the Medicines you take and that have been prescribed to you. Take any new Medicines after you have completely understood and accpet all  the possible adverse reactions/side effects.   Do not drive when taking Pain medications or sleeping medications (Benzodaizepines)  Do not take more than prescribed Pain, Sleep and Anxiety Medications. It is not advisable to combine anxiety,sleep and pain medications without talking with your primary care practitioner  Special Instructions: If you have smoked or chewed Tobacco  in the last 2 yrs please stop smoking, stop any regular Alcohol  and or any Recreational drug use.  Wear Seat belts while driving.  Please note: You were cared for by a hospitalist during your hospital stay. Once you are discharged, your primary care physician will handle any further medical issues. Please note that NO REFILLS for any discharge medications will be authorized once you are discharged, as it is imperative that you return to your primary care physician (or establish a relationship with a primary care physician if you do not have one) for your post hospital discharge needs so that they can reassess your need for medications and monitor your lab values.   Increase activity slowly   Complete by:  As directed      Allergies as of 04/03/2018   No Known Allergies     Medication List    STOP taking these medications   HYDROcodone-acetaminophen 7.5-325 MG tablet Commonly known as:  NORCO     TAKE these medications   amoxicillin-clavulanate 875-125 MG tablet Commonly known as:  AUGMENTIN Take 1 tablet by mouth 2 (two) times daily.   doxycycline 100 MG tablet Commonly known as:  VIBRA-TABS Take 1 tablet (100 mg total) by mouth 2 (two) times daily.   MULTI-VITAMINS Tabs Take 1 tablet by mouth daily.      Follow-up Information    Mliss SaxKremer, William Alfred, MD. Schedule an appointment as soon as possible for a visit in 3 day(s).   Specialty:  Family Medicine Contact information: [address] [place] [id number] [phone]          No Known Allergies  Consultations:    None   Other Procedures/Studies: Ct Pelvis W Contrast  Result Date: 04/01/2018 CLINICAL DATA:  52 year old female with a history of gluteal abscess, prior incision and drainage and local wound care EXAM: CT PELVIS WITH CONTRAST TECHNIQUE: Multidetector CT imaging of the pelvis was performed using the standard protocol following the bolus administration of intravenous contrast. CONTRAST:  100mL ISOVUE-300 IOPAMIDOL (ISOVUE-300) INJECTION 61% COMPARISON:  None. FINDINGS: Urinary Tract:  Urinary bladder relatively decompressed. Bowel: Visualized aspects of small bowel unremarkable. Normal appendix is visualized. Unremarkable appearance of visualized colon with mild stool burden Vascular/Lymphatic: Minimal atherosclerotic changes of the visualized arteries. Bilateral iliac arteries and proximal femoral arteries are patent. Borderline enlarged lymph nodes of the left inguinal region. Small lymph nodes of the right inguinal region. Small lymph nodes of the left external iliac nodal stations. Reproductive: Surgical changes of hysterectomy. Follicular changes versus small cyst of the left ovary with no comparison. Other: Inflammatory changes with edema within the left gluteal soft tissues extending to the midline cleft. There are a few small locules of gas in this region. No focal fluid to represent an abscess. Mild skin thickening. Musculoskeletal: No acute displaced fracture. IMPRESSION: Edema and inflammatory  changes within the medial soft tissues of the left gluteal region extending to the midline with no focal fluid. There are a few locules of gas which are compatible with the given history of surgical intervention. Findings suggest localized cellulitis with no evidence of abscess. Reactive inguinal lymph nodes, most pronounced on the left. Left ovarian cyst or follicle measuring 2.6 cm. If the patient is premenopausal this requires no specific follow-up. If the patient is known to be postmenopausal, referral  for Ob GYN evaluation and initiation of annual ultrasound surveillance is warranted. Electronically Signed   By: Gilmer Mor D.O.   On: 04/01/2018 17:06     TODAY-DAY OF DISCHARGE:  Subjective:   Ashok Pall today has no headache,no chest abdominal pain,no new weakness tingling or numbness, feels much better wants to go home today.   Objective:   Blood pressure 126/76, pulse 65, temperature 97.8 F (36.6 C), temperature source Oral, resp. rate 16, height 5\' 6"  (1.676 m), weight 73 kg, SpO2 99 %.  Intake/Output Summary (Last 24 hours) at 04/03/2018 0858 Last data filed at 04/03/2018 0600 Gross per 24 hour  Intake 2874.8 ml  Output 3700 ml  Net -825.2 ml   Filed Weights   04/01/18 1225  Weight: 73 kg    Exam: Awake Alert, Oriented *3, No new F.N deficits, Normal affect [place].AT,PERRAL Supple Neck,No JVD, No cervical lymphadenopathy appriciated.  Symmetrical Chest wall movement, Good air movement bilaterally, CTAB RRR,No Gallops,Rubs or new Murmurs, No Parasternal Heave +ve B.Sounds, Abd Soft, Non tender, No organomegaly appriciated, No rebound -guarding or rigidity. No Cyanosis, Clubbing or edema, No new Rash or bruise   PERTINENT RADIOLOGIC STUDIES: Ct Pelvis W Contrast  Result Date: 04/01/2018 CLINICAL DATA:  52 year old female with a history of gluteal abscess, prior incision and drainage and local wound care EXAM: CT PELVIS WITH CONTRAST TECHNIQUE: Multidetector CT imaging of the pelvis was performed using the standard protocol following the bolus administration of intravenous contrast. CONTRAST:  ISOVUE-300 IOPAMIDOL (ISOVUE-300) INJECTION 61% COMPARISON:  None. FINDINGS: Urinary Tract:  Urinary bladder relatively decompressed. Bowel: Visualized aspects of small bowel unremarkable. Normal appendix is visualized. Unremarkable appearance of visualized colon with mild stool burden Vascular/Lymphatic: Minimal atherosclerotic changes of the visualized arteries. Bilateral  iliac arteries and proximal femoral arteries are patent. Borderline enlarged lymph nodes of the left inguinal region. Small lymph nodes of the right inguinal region. Small lymph nodes of the left external iliac nodal stations. Reproductive: Surgical changes of hysterectomy. Follicular changes versus small cyst of the left ovary with no comparison. Other: Inflammatory changes with edema within the left gluteal soft tissues extending to the midline cleft. There are a few small locules of gas in this region. No focal fluid to represent an abscess. Mild skin thickening. Musculoskeletal: No acute displaced fracture. IMPRESSION: Edema and inflammatory changes within the medial soft tissues of the left gluteal region extending to the midline with no focal fluid. There are a few locules of gas which are compatible with the given history of surgical intervention. Findings suggest localized cellulitis with no evidence of abscess. Reactive inguinal lymph nodes, most pronounced on the left. Left ovarian cyst or follicle measuring 2.6 cm. If the patient is premenopausal this requires no specific follow-up. If the patient is known to be postmenopausal, referral for Ob GYN evaluation and initiation of annual ultrasound surveillance is warranted. Electronically Signed   By: Gilmer Mor D.O.   On: 04/01/2018 17:06     PERTINENT LAB RESULTS: CBC: Recent  Labs    04/01/18 1505 04/02/18 0414  WBC 9.3 6.3  HGB 12.3 12.1  HCT 38.7 37.2  PLT 214 229   CMET CMP     Component Value Date/Time   NA 141 04/03/2018 0437   NA 140 03/18/2016   K 3.9 04/03/2018 0437   CL 108 04/03/2018 0437   CO2 25 04/03/2018 0437   GLUCOSE 99 04/03/2018 0437   BUN 16 04/03/2018 0437   BUN 18 03/18/2016   CREATININE 0.74 04/03/2018 0437   CREATININE 0.93 02/10/2018 0849   CALCIUM 8.5 (L) 04/03/2018 0437   PROT 6.4 (L) 04/02/2018 0414   ALBUMIN 3.7 04/02/2018 0414   AST 16 04/02/2018 0414   ALT 17 04/02/2018 0414   ALKPHOS 47  04/02/2018 0414   BILITOT 0.8 04/02/2018 0414   GFRNONAA >60 04/03/2018 0437   GFRAA >60 04/03/2018 0437    GFR Estimated Creatinine Clearance: 84.2 mL/min (by C-G formula based on SCr of 0.74 mg/dL). No results for input(s): LIPASE, AMYLASE in the last 72 hours. No results for input(s): CKTOTAL, CKMB, CKMBINDEX, TROPONINI in the last 72 hours. Invalid input(s): POCBNP No results for input(s): DDIMER in the last 72 hours. No results for input(s): HGBA1C in the last 72 hours. No results for input(s): CHOL, HDL, LDLCALC, TRIG, CHOLHDL, LDLDIRECT in the last 72 hours. Recent Labs    04/02/18 0414  TSH 5.235*   No results for input(s): VITAMINB12, FOLATE, FERRITIN, TIBC, IRON, RETICCTPCT in the last 72 hours. Coags: No results for input(s): INR in the last 72 hours.  Invalid input(s): PT Microbiology: Recent Results (from the past 240 hour(s))  Blood culture (routine x 2)     Status: None (Preliminary result)   Collection Time: 04/01/18  7:30 PM  Result Value Ref Range Status   Specimen Description   Final    RIGHT ANTECUBITAL Performed at [facility], [address] [place], [place] [address]    Special Requests   Final    BOTTLES DRAWN AEROBIC ONLY Blood Culture adequate volume Performed at [facility], [address] [place], [place] [address]    Culture   Final    NO GROWTH < 12 HOURS Performed at [facility], [address] [place], [place] [address]    Report Status PENDING  Incomplete  MRSA PCR Screening     Status: None   Collection Time: 04/01/18 10:19 PM  Result Value Ref Range Status   MRSA by PCR NEGATIVE NEGATIVE Final    Comment:        The GeneXpert MRSA Assay (FDA approved for NASAL specimens only), is one component of a comprehensive MRSA colonization surveillance program. It is not intended to diagnose MRSA infection nor to guide or monitor treatment for MRSA infections. Performed at [facility], [address], [place], [place] [address]     FURTHER DISCHARGE INSTRUCTIONS:  Get Medicines reviewed and adjusted: Please take all your medications with you for your next visit with your Primary MD  Laboratory/radiological data: Please request your Primary MD to go over all hospital tests and procedure/radiological results at the follow up, please ask your Primary MD to get all Hospital records sent to his/her office.  In some cases, they will be blood work, cultures and biopsy results pending at the time of your discharge. Please request that your primary care M.D. goes through all the records of your hospital data and follows up on these  results.  Also Note the following: If you experience worsening of your admission symptoms, develop shortness of breath, life threatening emergency, suicidal or homicidal thoughts you must seek medical attention immediately by calling 911 or calling your MD immediately  if symptoms less severe.  You must read complete instructions/literature along with all the possible adverse reactions/side effects for all the Medicines you take and that have been prescribed to you. Take any new Medicines after you have completely understood and accpet all the possible adverse reactions/side effects.   Do not drive when taking Pain medications or sleeping medications (Benzodaizepines)  Do not take more than prescribed Pain, Sleep and Anxiety Medications. It is not advisable to combine anxiety,sleep and pain medications without talking with your primary care practitioner  Special Instructions: If you have smoked or chewed Tobacco  in the last 2 yrs please stop smoking, stop any regular Alcohol  and or any Recreational drug use.  Wear Seat belts while driving.  Please note: You were cared for by a hospitalist during your hospital stay. Once you are discharged, your primary care physician will handle any further medical issues. Please note that NO REFILLS for  any discharge medications will be authorized once you are discharged, as it is imperative that you return to your primary care physician (or establish a relationship with a primary care physician if you do not have one) for your post hospital discharge needs so that they can reassess your need for medications and monitor your lab values.  Total Time spent coordinating discharge including counseling, education and face to face time equals 25  minutes.  SignedJeoffrey Massed: Neal Trulson 04/03/2018 8:58 AM

## 2018-04-03 NOTE — Care Management Note (Signed)
Case Management Note  Patient Details  Name: Erica Dickson MRN: [id number] Date of Birth: 10/01/1965  Subjective/Objective:  Lt thigh abscess                  Action/Plan: NCM spoke to pt and [facility] Pt declined HH RN at this time. States she feels her dtr and husband will be able to manage dressing changes at home. States she will be traveling out of town on 04/07/2018.  Expected Discharge Date:  04/03/18               Expected Discharge Plan:  Home w Home Health Services  In-House Referral:  NA  Discharge planning Services  CM Consult  Post Acute Care Choice:  Home Health Choice offered to:  Patient  DME Arranged:  N/A DME Agency:  NA  HH Arranged:  Patient Refused HH HH Agency:  NA  Status of Service:  Completed, signed off  If discussed at Long Length of Stay Meetings, dates discussed:    Additional Comments:  Elliot CousinShavis, Bracha Frankowski Ellen, RN 04/03/2018, 1:23 PM

## 2018-04-03 NOTE — Progress Notes (Signed)
Nurse reviewed discharge instructions with pt.  Pt verbalized understanding of discharge instructions, follow up appointment and new medications.  Prescriptions electronically sent to pt's pharmacy.  Pt's daughter feels comfortable with helping with the packing of wound.

## 2018-04-04 ENCOUNTER — Telehealth: Payer: Self-pay | Admitting: Behavioral Health

## 2018-04-04 NOTE — Telephone Encounter (Signed)
Transition Care Management Follow-up Telephone Call  Admit Date: 04/01/2018 Discharge date: 04/03/2018  Recommendations for Outpatient Follow-up:  1. Follow up with PCP in 2-3 days  2. Repeat TSH in 3 months 3.   Has an ovarian cyst-further work-up deferred to the outpatient setting    How have you been since you were released from the hospital? Patient stated, "I'm doing better, the abscess is closing up; I'm currently taking antibiotics for 7 more days".   Do you understand why you were in the hospital? yes   Do you understand the discharge instructions? yes   Where were you discharged to? Home   Items Reviewed:  Medications reviewed: yes  Allergies reviewed: yes, NKA  Dietary changes reviewed: yes, regular diet  Referrals reviewed: yes, Follow up with PCP in 2-3 days; Has an ovarian cyst-further work-up deferred to the outpatient setting   Functional Questionnaire:   Activities of Daily Living (ADLs):   She states they are independent in the following: ambulation, bathing and hygiene, feeding, continence, grooming, toileting and dressing States they require assistance with the following: None   Any transportation issues/concerns?: no   Any patient concerns? no   Confirmed importance and date/time of follow-up visits scheduled yes, 04/07/18 at 9:00 AM.  Provider Appointment booked with Dr. Doreene BurkeKremer.  Confirmed with patient if condition begins to worsen call PCP or go to the ER.  Patient was given the office number and encouraged to call back with question or concerns.  : yes

## 2018-04-06 LAB — CULTURE, BLOOD (ROUTINE X 2)
CULTURE: NO GROWTH
Special Requests: ADEQUATE

## 2018-04-07 ENCOUNTER — Inpatient Hospital Stay: Payer: PRIVATE HEALTH INSURANCE | Admitting: Family Medicine

## 2018-04-07 ENCOUNTER — Encounter: Payer: Self-pay | Admitting: Family Medicine

## 2018-04-07 ENCOUNTER — Ambulatory Visit (INDEPENDENT_AMBULATORY_CARE_PROVIDER_SITE_OTHER): Payer: PRIVATE HEALTH INSURANCE | Admitting: Family Medicine

## 2018-04-07 VITALS — BP 110/70 | HR 85 | Ht 66.0 in

## 2018-04-07 DIAGNOSIS — L03818 Cellulitis of other sites: Secondary | ICD-10-CM

## 2018-04-07 DIAGNOSIS — L02215 Cutaneous abscess of perineum: Secondary | ICD-10-CM

## 2018-04-07 DIAGNOSIS — N83202 Unspecified ovarian cyst, left side: Secondary | ICD-10-CM

## 2018-04-07 NOTE — Progress Notes (Signed)
Established Patient Office Visit  Subjective:  Patient ID: Erica Dickson, female    DOB: 1965/07/10  Age: 52 y.o. MRN: [id number]  CC:  Chief Complaint  Patient presents with  . Hospitalization Follow-up    HPI Erica Dickson presents for hospitalization follow-up.    CT scan of her perineum did not show an abscess but confirms cellulitis instead.  Patient was tested for MRSA and treated with vancomycin.  She was then released to home 48 hours later on oral antibiotic therapy.  She is since done well..  Her daughter is who is in her final year of nursing school and is repacked the wound daily.  Wound has minimal drainage.  They are leaving today for [place] by car.  There is been no further fever or chills.  Incidental 2.6 cm left ovarian cyst was noted.  Patient feels as though she is near menopause.  She is status post hysterectomy.  Past Medical History:  Diagnosis Date  . Hepatic steatosis    MRI 01/2016  . Hyperlipidemia   . Sciatic pain   . Urinary tract infection     Past Surgical History:  Procedure Laterality Date  . ABDOMINAL HYSTERECTOMY    . AUGMENTATION MAMMAPLASTY Bilateral 2014  . TONSILLECTOMY      Family History  Problem Relation Age of Onset  . Hyperlipidemia Mother   . Heart disease Mother   . Hypertension Mother   . Stroke Father   . Diabetes Father   . Heart disease Brother 22       MI    Social History   Socioeconomic History  . Marital status: Married    Spouse name: Not on file  . Number of children: Not on file  . Years of education: Not on file  . Highest education level: Not on file  Occupational History  . Not on file  Social Needs  . Financial resource strain: Not on file  . Food insecurity:    Worry: Not on file    Inability: Not on file  . Transportation needs:    Medical: Not on file    Non-medical: Not on file  Tobacco Use  . Smoking status: Former Games developer  . Smokeless tobacco: Never Used  Substance and Sexual Activity  .  Alcohol use: Yes    Comment: social  . Drug use: No  . Sexual activity: Not on file  Lifestyle  . Physical activity:    Days per week: Not on file    Minutes per session: Not on file  . Stress: Not on file  Relationships  . Social connections:    Talks on phone: Not on file    Gets together: Not on file    Attends religious service: Not on file    Active member of club or organization: Not on file    Attends meetings of clubs or organizations: Not on file    Relationship status: Not on file  . Intimate partner violence:    Fear of current or ex partner: Not on file    Emotionally abused: Not on file    Physically abused: Not on file    Forced sexual activity: Not on file  Other Topics Concern  . Not on file  Social History Narrative  . Not on file    Outpatient Medications Prior to Visit  Medication Sig Dispense Refill  . amoxicillin-clavulanate (AUGMENTIN) 875-125 MG tablet Take 1 tablet by mouth 2 (two) times daily. 10 tablet 0  .  doxycycline (VIBRA-TABS) 100 MG tablet Take 1 tablet (100 mg total) by mouth 2 (two) times daily. 10 tablet 0  . Multiple Vitamin (MULTI-VITAMINS) TABS Take 1 tablet by mouth daily.     No facility-administered medications prior to visit.     No Known Allergies  ROS Review of Systems  Constitutional: Negative.   Respiratory: Negative.   Cardiovascular: Negative.   Gastrointestinal: Negative.   Skin: Positive for wound. Negative for color change, pallor and rash.      Objective:    Physical Exam  Constitutional: She is oriented to person, place, and time. She appears well-developed and well-nourished. No distress.  HENT:  Head: Normocephalic and atraumatic.  Right Ear: External ear normal.  Left Ear: External ear normal.  Eyes: Right eye exhibits no discharge. Left eye exhibits no discharge. No scleral icterus.  Pulmonary/Chest: Effort normal.  Neurological: She is alert and oriented to person, place, and time.  Skin: Skin is  warm and dry. No rash noted. She is not diaphoretic. No erythema. No pallor.  Psychiatric: She has a normal mood and affect. Her behavior is normal.    BP 110/70   Pulse 85   Ht 5\' 6"  (1.676 m)   SpO2 96%   BMI 25.99 kg/m  Wt Readings from Last 3 Encounters:  04/01/18 161 lb (73 kg)  04/01/18 162 lb (73.5 kg)  03/30/18 162 lb (73.5 kg)   BP Readings from Last 3 Encounters:  04/07/18 110/70  04/03/18 126/76  04/01/18 122/80   Guideline developer:  UpToDate (see UpToDate for funding source) Date Released: June 2014  Health Maintenance Due  Topic Date Due  . PAP SMEAR-Modifier  04/16/2018    There are no preventive care reminders to display for this patient.  Lab Results  Component Value Date   TSH 5.235 (H) 04/02/2018   Lab Results  Component Value Date   WBC 6.3 04/02/2018   HGB 12.1 04/02/2018   HCT 37.2 04/02/2018   MCV 96.1 04/02/2018   PLT 229 04/02/2018   Lab Results  Component Value Date   NA 141 04/03/2018   K 3.9 04/03/2018   CO2 25 04/03/2018   GLUCOSE 99 04/03/2018   BUN 16 04/03/2018   CREATININE 0.74 04/03/2018   BILITOT 0.8 04/02/2018   ALKPHOS 47 04/02/2018   AST 16 04/02/2018   ALT 17 04/02/2018   PROT 6.4 (L) 04/02/2018   ALBUMIN 3.7 04/02/2018   CALCIUM 8.5 (L) 04/03/2018   ANIONGAP 8 04/03/2018   GFR 77.94 12/09/2016   Lab Results  Component Value Date   CHOL 260 (H) 02/10/2018   Lab Results  Component Value Date   HDL 82 02/10/2018   Lab Results  Component Value Date   LDLCALC 156 (H) 02/10/2018   Lab Results  Component Value Date   TRIG 106 02/10/2018   Lab Results  Component Value Date   CHOLHDL 3.2 02/10/2018   Lab Results  Component Value Date   HGBA1C 5.2 03/18/2016      Assessment & Plan:   Problem List Items Addressed This Visit      Endocrine   RESOLVED: Ovarian cyst - Primary   Relevant Orders   Ambulatory referral to Obstetrics / Gynecology     Other   Abscess of perineum   Cellulitis       No orders of the defined types were placed in this encounter. Patient will finish her antibiotics and remove the packing herself on Saturday.  Her ovarian cyst  will be followed up by GYN possibly in their office with ultrasound.  Follow-up: No follow-ups on file.

## 2018-05-11 ENCOUNTER — Encounter: Payer: Self-pay | Admitting: Obstetrics & Gynecology

## 2018-05-11 ENCOUNTER — Ambulatory Visit (INDEPENDENT_AMBULATORY_CARE_PROVIDER_SITE_OTHER): Payer: PRIVATE HEALTH INSURANCE | Admitting: Obstetrics & Gynecology

## 2018-05-11 VITALS — BP 125/75 | HR 69 | Ht 65.0 in | Wt 167.1 lb

## 2018-05-11 DIAGNOSIS — Z01419 Encounter for gynecological examination (general) (routine) without abnormal findings: Secondary | ICD-10-CM

## 2018-05-11 DIAGNOSIS — N951 Menopausal and female climacteric states: Secondary | ICD-10-CM

## 2018-05-11 DIAGNOSIS — Z1231 Encounter for screening mammogram for malignant neoplasm of breast: Secondary | ICD-10-CM

## 2018-05-11 MED ORDER — ESTRADIOL 0.025 MG/24HR TD PTTW: 1.0000 | MEDICATED_PATCH | TRANSDERMAL | 3 refills | Status: DC

## 2018-05-11 NOTE — Patient Instructions (Signed)

## 2018-05-11 NOTE — Progress Notes (Signed)
Subjective:     Erica Dickson is a 53 y.o. female here for a routine exam.  Current complaints: pt reports hot flushes. 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  Pt reports that in 03/2018 she was seen for an abscess and a cyst was noted on her ovary. Pt denies any pain or problems. Pt is sexually active.     Gynecologic History No LMP recorded. Patient has had a hysterectomy. Contraception: status post hysterectomy Last Pap: n/a Last mammogram: 04/22/2017. Results were: normal   Obstetric History OB History  Gravida Para Term Preterm AB Living  1 1 1     1   SAB TAB Ectopic Multiple Live Births          1    # Outcome Date GA Lbr Len/2nd Weight Sex Delivery Anes PTL Lv  1 Term 1998 [redacted]w[redacted]d   F Vag-Spont   LIV   The following portions of the patient's history were reviewed and updated as appropriate: allergies, current medications, past family history, past medical history, past social history, past surgical history and problem list.  Review of Systems Pertinent items are noted in HPI.    Objective:  BP 125/75   Pulse 69   Ht 5\' 5"  (1.651 m)   Wt 167 lb 1.3 oz (75.8 kg)   BMI 27.80 kg/m   General Appearance:    Alert, cooperative, no distress, appears stated age  Head:    Normocephalic, without obvious abnormality, atraumatic  Eyes:    conjunctiva/corneas clear, EOM's intact, both eyes  Ears:    Normal external ear canals, both ears  Nose:   Nares normal, septum midline, mucosa normal, no drainage    or sinus tenderness  Throat:   Lips, mucosa, and tongue normal; teeth and gums normal  Neck:   Supple, symmetrical, trachea midline, no adenopathy;    thyroid:  no enlargement/tenderness/nodules  Back:     Symmetric, no curvature, ROM normal, no CVA tenderness  Lungs:     Clear to auscultation bilaterally, respirations unlabored  Chest Wall:    No tenderness or deformity   Heart:    Regular rate and rhythm, S1 and S2 normal, no murmur, rub   or gallop  Breast Exam:    No tenderness,  masses, or nipple abnormality  Abdomen:     Soft, non-tender, bowel sounds active all four quadrants,    no masses, no organomegaly  Genitalia:    Normal female without lesion, discharge or tenderness     Extremities:   Extremities normal, atraumatic, no cyanosis or edema  Pulses:   2+ and symmetric all extremities  Skin:   Skin color, texture, turgor normal, no rashes or lesions    04/01/2018 CLINICAL DATA:  53 year old female with a history of gluteal abscess, prior incision and drainage and local wound care  EXAM: CT PELVIS WITH CONTRAST  TECHNIQUE: Multidetector CT imaging of the pelvis was performed using the standard protocol following the bolus administration of intravenous contrast.  CONTRAST:  ISOVUE-300 IOPAMIDOL (ISOVUE-300) INJECTION 61%  COMPARISON:  None.  FINDINGS: Urinary Tract:  Urinary bladder relatively decompressed.  Bowel: Visualized aspects of small bowel unremarkable. Normal appendix is visualized. Unremarkable appearance of visualized colon with mild stool burden  Vascular/Lymphatic: Minimal atherosclerotic changes of the visualized arteries. Bilateral iliac arteries and proximal femoral arteries are patent.  Borderline enlarged lymph nodes of the left inguinal region. Small lymph nodes of the right inguinal region. Small lymph nodes of the left external iliac  nodal stations.  Reproductive: Surgical changes of hysterectomy. Follicular changes versus small cyst of the left ovary with no comparison.  Other: Inflammatory changes with edema within the left gluteal soft tissues extending to the midline cleft. There are a few small locules of gas in this region. No focal fluid to represent an abscess. Mild skin thickening.  Musculoskeletal: No acute displaced fracture.  IMPRESSION: Edema and inflammatory changes within the medial soft tissues of the left gluteal region extending to the midline with no focal fluid. There are a  few locules of gas which are compatible with the given history of surgical intervention. Findings suggest localized cellulitis with no evidence of abscess.  Reactive inguinal lymph nodes, most pronounced on the left.  Left ovarian cyst or follicle measuring 2.6 cm. If the patient is premenopausal this requires no specific follow-up. If the patient is known to be postmenopausal, referral for Ob GYN evaluation and initiation of annual ultrasound surveillance is warranted.  Assessment:    Healthy female exam.   Breast cancer screen Sx menopause   Plan:    Mammogram ordered.    F/u in 1 year or sooner prn EES patch Vivelle patch 2x/week F/u in 6 weeks  Patient with bothersome menopausal vasomotor symptoms. Discussed lifestyle interventions such as wearing light clothing, remaining in cool environments, having fan/air conditioner in the room, avoiding hot beverages etc.  Discussed using hormone therapy and concerns about increased risk of heart disease, cerebrovascular disease, thromboembolic disease,  and breast cancer.  Also discussed other medical options such as Paxil, Effexor or Neurontin.   Also discussed alternative therapies such as herbal remedies but cautioned that most of the products contained phytoestrogens (plant estrogens) in unregulated amounts which can have the same effects on the body as the pharmaceutical estrogen preparations.  Also referred her to www.[email or web address].org for other alternative options.  Patient opted for estrogen therapy for now, wants to try the patch.  She will return in 2 months for reevaluation.  Erica Dickson, M.D., [facility] Dickson, M.D., [facility]

## 2018-05-11 NOTE — Addendum Note (Signed)
Addended by: Mikey Bussing on: 05/11/2018 03:32 PM   Modules accepted: Orders

## 2018-05-17 ENCOUNTER — Other Ambulatory Visit: Payer: Self-pay

## 2018-05-30 LAB — PR SCREENING MAMMOGRAM RESULTS DOCUMENTED AND REVIEWED

## 2018-06-13 ENCOUNTER — Ambulatory Visit: Payer: PRIVATE HEALTH INSURANCE

## 2018-06-29 ENCOUNTER — Ambulatory Visit: Payer: PRIVATE HEALTH INSURANCE | Admitting: Obstetrics & Gynecology

## 2018-08-05 ENCOUNTER — Ambulatory Visit: Payer: PRIVATE HEALTH INSURANCE | Admitting: Family Medicine

## 2018-08-26 ENCOUNTER — Other Ambulatory Visit: Payer: Self-pay

## 2018-08-26 DIAGNOSIS — N951 Menopausal and female climacteric states: Secondary | ICD-10-CM

## 2018-08-26 MED ORDER — ESTRADIOL 0.025 MG/24HR TD PTTW: 1.0000 | MEDICATED_PATCH | TRANSDERMAL | 6 refills | Status: DC

## 2018-08-26 NOTE — Telephone Encounter (Signed)
Refill requested via fax

## 2019-02-15 ENCOUNTER — Telehealth: Payer: Self-pay

## 2019-02-15 ENCOUNTER — Encounter: Payer: PRIVATE HEALTH INSURANCE | Admitting: Family Medicine

## 2019-02-15 NOTE — Telephone Encounter (Signed)

## 2019-02-16 ENCOUNTER — Encounter: Payer: Self-pay | Admitting: Family Medicine

## 2019-02-16 ENCOUNTER — Other Ambulatory Visit: Payer: Self-pay

## 2019-02-16 ENCOUNTER — Ambulatory Visit (INDEPENDENT_AMBULATORY_CARE_PROVIDER_SITE_OTHER): Payer: PRIVATE HEALTH INSURANCE | Admitting: Family Medicine

## 2019-02-16 VITALS — BP 124/70 | HR 76 | Ht 65.0 in | Wt 178.5 lb

## 2019-02-16 DIAGNOSIS — R7989 Other specified abnormal findings of blood chemistry: Secondary | ICD-10-CM | POA: Diagnosis not present

## 2019-02-16 DIAGNOSIS — Z0000 Encounter for general adult medical examination without abnormal findings: Secondary | ICD-10-CM

## 2019-02-16 LAB — CBC
HCT: 42.5 % (ref 36.0–46.0)
Hemoglobin: 14 g/dL (ref 12.0–15.0)
MCHC: 33 g/dL (ref 30.0–36.0)
MCV: 96.1 fl (ref 78.0–100.0)
Platelets: 246 10*3/uL (ref 150.0–400.0)
RBC: 4.42 Mil/uL (ref 3.87–5.11)
RDW: 13 % (ref 11.5–15.5)
WBC: 4.6 10*3/uL (ref 4.0–10.5)

## 2019-02-16 LAB — COMPREHENSIVE METABOLIC PANEL
ALT: 26 U/L (ref 0–35)
AST: 21 U/L (ref 0–37)
Albumin: 4.7 g/dL (ref 3.5–5.2)
Alkaline Phosphatase: 65 U/L (ref 39–117)
BUN: 15 mg/dL (ref 6–23)
CO2: 28 mEq/L (ref 19–32)
Calcium: 9.6 mg/dL (ref 8.4–10.5)
Chloride: 103 mEq/L (ref 96–112)
Creatinine, Ser: 0.82 mg/dL (ref 0.40–1.20)
GFR: 72.72 mL/min (ref >60.00)
Glucose, Bld: 112 mg/dL — ABNORMAL HIGH (ref 70–99)
Potassium: 4.7 mEq/L (ref 3.5–5.1)
Sodium: 139 mEq/L (ref 135–145)
Total Bilirubin: 0.4 mg/dL (ref 0.2–1.2)
Total Protein: 7.4 g/dL (ref 6.0–8.3)

## 2019-02-16 LAB — LIPID PANEL
Cholesterol: 294 mg/dL — ABNORMAL HIGH (ref 0–200)
HDL: 55.2 mg/dL (ref >39.00)
LDL Cholesterol: 200 mg/dL — ABNORMAL HIGH (ref 0–99)
NonHDL: 238.47
Total CHOL/HDL Ratio: 5
Triglycerides: 193 mg/dL — ABNORMAL HIGH (ref 0.0–149.0)
VLDL: 38.6 mg/dL (ref 0.0–40.0)

## 2019-02-16 LAB — TSH: TSH: 2.71 u[IU]/mL (ref 0.35–4.50)

## 2019-02-16 LAB — URINALYSIS, ROUTINE W REFLEX MICROSCOPIC
Bilirubin Urine: NEGATIVE
Hgb urine dipstick: NEGATIVE
Ketones, ur: NEGATIVE
Leukocytes,Ua: NEGATIVE
Nitrite: NEGATIVE
RBC / HPF: NONE SEEN (ref 0–?)
Specific Gravity, Urine: 1.01 (ref 1.000–1.030)
Total Protein, Urine: NEGATIVE
Urine Glucose: NEGATIVE
Urobilinogen, UA: 0.2 (ref 0.0–1.0)
pH: 6.5 (ref 5.0–8.0)

## 2019-02-16 NOTE — Progress Notes (Addendum)
Established Patient Office Visit  Subjective:  Patient ID: Erica Dickson, female    DOB: 1966/01/05  Age: 53 y.o. MRN: [id number]  CC:  Chief Complaint  Patient presents with  . Annual Exam    HPI Erica Dickson presents for her yearly physical exam.  She is fasting this morning.  With the pandemic she has gained weight.  She is conscious about this.  [facility] and has been cooking more at home.  She did have her well woman care back in January.  She is seeing the dentist this year and plans to see her eye doctor within a month.  She is school and has gone back to work as well.  Daughter will finish nursing school.  Tentative plans to move to [place].  Past Medical History:  Diagnosis Date  . Hepatic steatosis    MRI 01/2016  . Hyperlipidemia   . Sciatic pain   . Urinary tract infection     Past Surgical History:  Procedure Laterality Date  . ABDOMINAL HYSTERECTOMY    . AUGMENTATION MAMMAPLASTY Bilateral 2014  . TONSILLECTOMY      Family History  Problem Relation Age of Onset  . Hyperlipidemia Mother   . Heart disease Mother   . Hypertension Mother   . Stroke Father   . Diabetes Father   . Heart disease Brother 59       MI    Social History   Socioeconomic History  . Marital status: Married    Spouse name: Not on file  . Number of children: Not on file  . Years of education: Not on file  . Highest education level: Not on file  Occupational History  . Not on file  Social Needs  . Financial resource strain: Not on file  . Food insecurity    Worry: Not on file    Inability: Not on file  . Transportation needs    Medical: Not on file    Non-medical: Not on file  Tobacco Use  . Smoking status: Former Research scientist (life sciences)  . Smokeless tobacco: Never Used  Substance and Sexual Activity  . Alcohol use: Yes    Comment: 3-4 glasses of wine through the weekend.   . Drug use: No  . Sexual activity: Yes    Birth control/protection:  None  Lifestyle  . Physical activity    Days per week: Not on file    Minutes per session: Not on file  . Stress: Not on file  Relationships  . Social Herbalist on phone: Not on file    Gets together: Not on file    Attends religious service: Not on file    Active member of club or organization: Not on file    Attends meetings of clubs or organizations: Not on file    Relationship status: Not on file  . Intimate partner violence    Fear of current or ex partner: Not on file    Emotionally abused: Not on file    Physically abused: Not on file    Forced sexual activity: Not on file  Other Topics Concern  . Not on file  Social History Narrative  . Not on file    Outpatient Medications Prior to Visit  Medication Sig Dispense Refill  . estradiol (VIVELLE-DOT) 0.025 MG/24HR Place 1 patch onto the skin 2 (two) times a week. 8 patch 6  . Multiple Vitamin (MULTI-VITAMINS)  TABS Take 1 tablet by mouth daily.    Marland Kitchen amoxicillin-clavulanate (AUGMENTIN) 875-125 MG tablet Take 1 tablet by mouth 2 (two) times daily. (Patient not taking: Reported on 05/11/2018) 10 tablet 0  . doxycycline (VIBRA-TABS) 100 MG tablet Take 1 tablet (100 mg total) by mouth 2 (two) times daily. (Patient not taking: Reported on 05/11/2018) 10 tablet 0   No facility-administered medications prior to visit.     No Known Allergies  ROS Review of Systems  Constitutional: Negative.   HENT: Negative.   Eyes: Negative for photophobia and visual disturbance.  Respiratory: Negative.   Cardiovascular: Negative.   Gastrointestinal: Negative.   Endocrine: Negative for polyphagia and polyuria.  Genitourinary: Negative.   Musculoskeletal: Negative for gait problem and joint swelling.  Allergic/Immunologic: Negative for immunocompromised state.  Neurological: Negative for light-headedness and headaches.  Hematological: Does not bruise/bleed easily.  Psychiatric/Behavioral: Negative.      0830       PHQ 2 & 9  Depression Scale- Over the past 2 weeks, how often have you been bothered by any of the following problems?   Little interest or pleasure in doing things Not at all  Feeling down, depressed, or hopeless (PHQ Adolescent also includes...irritable) Not at all  PHQ-2 Total Score 0      Office Visit from 02/16/2019 in [facility]  PHQ-2 Total Score  0       Objective:    Physical Exam  Constitutional: She is oriented to person, place, and time. She appears well-developed and well-nourished. No distress.  HENT:  Head: Normocephalic and atraumatic.  Right Ear: External ear normal.  Left Ear: External ear normal.  Mouth/Throat: Oropharynx is clear and moist. No oropharyngeal exudate.  Eyes: Pupils are equal, round, and reactive to light. Conjunctivae are normal. Right eye exhibits no discharge. Left eye exhibits no discharge. No scleral icterus.  Neck: Neck supple. No JVD present. No tracheal deviation present. No thyromegaly present.  Cardiovascular: Normal rate, regular rhythm and normal heart sounds.  Pulmonary/Chest: Effort normal and breath sounds normal. No stridor.  Abdominal: Bowel sounds are normal. There is no CVA tenderness.  Musculoskeletal:        General: No edema.  Lymphadenopathy:    She has no cervical adenopathy.  Neurological: She is alert and oriented to person, place, and time.  Skin: Skin is warm and dry. She is not diaphoretic.  Psychiatric: She has a normal mood and affect. Her behavior is normal.    BP 124/70   Pulse 76   Ht '5\' 5"'$  (1.651 m)   Wt 178 lb 8 oz (81 kg)   SpO2 98%   BMI 29.70 kg/m  Wt Readings from Last 3 Encounters:  02/16/19 178 lb 8 oz (81 kg)  05/11/18 167 lb 1.3 oz (75.8 kg)  04/01/18 161 lb (73 kg)   BP Readings from Last 3 Encounters:  02/16/19 124/70  05/11/18 125/75  04/07/18 110/70   Guideline developer:  UpToDate (see UpToDate for funding source) Date Released: June 2014  There are no preventive care  reminders to display for this patient.  There are no preventive care reminders to display for this patient.  Lab Results  Component Value Date   TSH 2.71 02/16/2019   Lab Results  Component Value Date   WBC 4.6 02/16/2019   HGB 14.0 02/16/2019   HCT 42.5 02/16/2019   MCV 96.1 02/16/2019   PLT 246.0 02/16/2019   Lab Results  Component Value Date  NA 139 02/16/2019   K 4.7 02/16/2019   CO2 28 02/16/2019   GLUCOSE 112 (H) 02/16/2019   BUN 15 02/16/2019   CREATININE 0.82 02/16/2019   BILITOT 0.4 02/16/2019   ALKPHOS 65 02/16/2019   AST 21 02/16/2019   ALT 26 02/16/2019   PROT 7.4 02/16/2019   ALBUMIN 4.7 02/16/2019   CALCIUM 9.6 02/16/2019   ANIONGAP 8 04/03/2018   GFR 72.72 02/16/2019   Lab Results  Component Value Date   CHOL 294 (H) 02/16/2019   Lab Results  Component Value Date   HDL 55.20 02/16/2019   Lab Results  Component Value Date   LDLCALC 200 (H) 02/16/2019   Lab Results  Component Value Date   TRIG 193.0 (H) 02/16/2019   Lab Results  Component Value Date   CHOLHDL 5 02/16/2019   Lab Results  Component Value Date   HGBA1C 5.2 03/18/2016     Assessment & Plan:   Problem List Items Addressed This Visit    None    Visit Diagnoses    Healthcare maintenance    -  Primary   Relevant Orders   CBC (Completed)   Comp Met (CMET) (Completed)   Lipid Profile (Completed)   Urinalysis, Routine w reflex microscopic (Completed)   Elevated TSH       Relevant Orders   TSH (Completed)      No orders of the defined types were placed in this encounter.   Follow-up: Return in about 6 months (around 08/16/2019), or if symptoms worsen or fail to improve.   Patient was given information on health maintenance, dental disease prevention and exercising to lose weight.  Suggested that she start a walking program.  Suggested that we may need to add a thyroid medicine.

## 2019-02-16 NOTE — Patient Instructions (Addendum)
Health Maintenance, Female Adopting a healthy lifestyle and getting preventive care are important in promoting health and wellness. Ask your health care provider about:  The right schedule for you to have regular tests and exams.  Things you can do on your own to prevent diseases and keep yourself healthy. What should I know about diet, weight, and exercise? Eat a healthy diet   Eat a diet that includes plenty of vegetables, fruits, low-fat dairy products, and lean protein.  Do not eat a lot of foods that are high in solid fats, added sugars, or sodium. Maintain a healthy weight Body mass index (BMI) is used to identify weight problems. It estimates body fat based on height and weight. Your health care provider can help determine your BMI and help you achieve or maintain a healthy weight. Get regular exercise Get regular exercise. This is one of the most important things you can do for your health. Most adults should:  Exercise for at least 150 minutes each week. The exercise should increase your heart rate and make you sweat (moderate-intensity exercise).  Do strengthening exercises at least twice a week. This is in addition to the moderate-intensity exercise.  Spend less time sitting. Even light physical activity can be beneficial. Watch cholesterol and blood lipids Have your blood tested for lipids and cholesterol at 53 years of age, then have this test every 5 years. Have your cholesterol levels checked more often if:  Your lipid or cholesterol levels are high.  You are older than 53 years of age.  You are at high risk for heart disease. What should I know about cancer screening? Depending on your health history and family history, you may need to have cancer screening at various ages. This may include screening for:  Breast cancer.  Cervical cancer.  Colorectal cancer.  Skin cancer.  Lung cancer. What should I know about heart disease, diabetes, and high blood  pressure? Blood pressure and heart disease  High blood pressure causes heart disease and increases the risk of stroke. This is more likely to develop in people who have high blood pressure readings, are of African descent, or are overweight.  Have your blood pressure checked: ? Every 3-5 years if you are 53-53 years of age. ? Every year if you are 93 years old or older. Diabetes Have regular diabetes screenings. This checks your fasting blood sugar level. Have the screening done:  Once every three years after age 69 if you are at a normal weight and have a low risk for diabetes.  More often and at a younger age if you are overweight or have a high risk for diabetes. What should I know about preventing infection? Hepatitis B If you have a higher risk for hepatitis B, you should be screened for this virus. Talk with your health care provider to find out if you are at risk for hepatitis B infection. Hepatitis C Testing is recommended for:  Everyone born from [birth year] through 1965.  Anyone with known risk factors for hepatitis C. Sexually transmitted infections (STIs)  Get screened for STIs, including gonorrhea and chlamydia, if: ? You are sexually active and are younger than 53 years of age. ? You are older than 53 years of age and your health care provider tells you that you are at risk for this type of infection. ? Your sexual activity has changed since you were last screened, and you are at increased risk for chlamydia or gonorrhea. Ask your health care provider if  you are at risk.  Ask your health care provider about whether you are at high risk for HIV. Your health care provider may recommend a prescription medicine to help prevent HIV infection. If you choose to take medicine to prevent HIV, you should first get tested for HIV. You should then be tested every 3 months for as long as you are taking the medicine. Pregnancy  If you are about to stop having your period (premenopausal) and  you may become pregnant, seek counseling before you get pregnant.  Take 400 to 800 micrograms (mcg) of folic acid every day if you become pregnant.  Ask for birth control (contraception) if you want to prevent pregnancy. Osteoporosis and menopause Osteoporosis is a disease in which the bones lose minerals and strength with aging. This can result in bone fractures. If you are 53 years old or older, or if you are at risk for osteoporosis and fractures, ask your health care provider if you should:  Be screened for bone loss.  Take a calcium or vitamin D supplement to lower your risk of fractures.  Be given hormone replacement therapy (HRT) to treat symptoms of menopause. Follow these instructions at home: Lifestyle  Do not use any products that contain nicotine or tobacco, such as cigarettes, e-cigarettes, and chewing tobacco. If you need help quitting, ask your health care provider.  Do not use street drugs.  Do not share needles.  Ask your health care provider for help if you need support or information about quitting drugs. Alcohol use  Do not drink alcohol if: ? Your health care provider tells you not to drink. ? You are pregnant, may be pregnant, or are planning to become pregnant.  If you drink alcohol: ? Limit how much you use to 0-1 drink a day. ? Limit intake if you are breastfeeding.  Be aware of how much alcohol is in your drink. In the [place] one drink equals one 12 oz bottle of beer (355 mL), one 5 oz glass of wine (148 mL), or one 1 oz glass of hard liquor (44 mL). General instructions  Schedule regular health, dental, and eye exams.  Stay current with your vaccines.  Tell your health care provider if: ? You often feel depressed. ? You have ever been abused or do not feel safe at home. Summary  Adopting a healthy lifestyle and getting preventive care are important in promoting health and wellness.  Follow your health care provider's instructions about healthy  diet, exercising, and getting tested or screened for diseases.  Follow your health care provider's instructions on monitoring your cholesterol and blood pressure. This information is not intended to replace advice given to you by your health care provider. Make sure you discuss any questions you have with your health care provider. Document Released: 10/13/2010 Document Revised: 03/23/2018 Document Reviewed: 03/23/2018 Elsevier Patient Education  2020 [employer]  Preventive Care 37-53 Years Old, Female Preventive care refers to visits with your health care provider and lifestyle choices that can promote health and wellness. This includes:  A yearly physical exam. This may also be called an annual well check.  Regular dental visits and eye exams.  Immunizations.  Screening for certain conditions.  Healthy lifestyle choices, such as eating a healthy diet, getting regular exercise, not using drugs or products that contain nicotine and tobacco, and limiting alcohol use. What can I expect for my preventive care visit? Physical exam Your health care provider will check your:  Height and weight. This  may be used to calculate body mass index (BMI), which tells if you are at a healthy weight.  Heart rate and blood pressure.  Skin for abnormal spots. Counseling Your health care provider may ask you questions about your:  Alcohol, tobacco, and drug use.  Emotional well-being.  Home and relationship well-being.  Sexual activity.  Eating habits.  Work and work Statistician.  Method of birth control.  Menstrual cycle.  Pregnancy history. What immunizations do I need?  Influenza (flu) vaccine  This is recommended every year. Tetanus, diphtheria, and pertussis (Tdap) vaccine  You may need a Td booster every 10 years. Varicella (chickenpox) vaccine  You may need this if you have not been vaccinated. Zoster (shingles) vaccine  You may need this after age 70. Measles,  mumps, and rubella (MMR) vaccine  You may need at least one dose of MMR if you were born in 1957 or later. You may also need a second dose. Pneumococcal conjugate (PCV13) vaccine  You may need this if you have certain conditions and were not previously vaccinated. Pneumococcal polysaccharide (PPSV23) vaccine  You may need one or two doses if you smoke cigarettes or if you have certain conditions. Meningococcal conjugate (MenACWY) vaccine  You may need this if you have certain conditions. Hepatitis A vaccine  You may need this if you have certain conditions or if you travel or work in places where you may be exposed to hepatitis A. Hepatitis B vaccine  You may need this if you have certain conditions or if you travel or work in places where you may be exposed to hepatitis B. Haemophilus influenzae type b (Hib) vaccine  You may need this if you have certain conditions. Human papillomavirus (HPV) vaccine  If recommended by your health care provider, you may need three doses over 6 months. You may receive vaccines as individual doses or as more than one vaccine together in one shot (combination vaccines). Talk with your health care provider about the risks and benefits of combination vaccines. What tests do I need? Blood tests  Lipid and cholesterol levels. These may be checked every 5 years, or more frequently if you are over 20 years old.  Hepatitis C test.  Hepatitis B test. Screening  Lung cancer screening. You may have this screening every year starting at age 60 if you have a 30-pack-year history of smoking and currently smoke or have quit within the past 15 years.  Colorectal cancer screening. All adults should have this screening starting at age 23 and continuing until age 8. Your health care provider may recommend screening at age 66 if you are at increased risk. You will have tests every 1-10 years, depending on your results and the type of screening test.  Diabetes  screening. This is done by checking your blood sugar (glucose) after you have not eaten for a while (fasting). You may have this done every 1-3 years.  Mammogram. This may be done every 1-2 years. Talk with your health care provider about when you should start having regular mammograms. This may depend on whether you have a family history of breast cancer.  BRCA-related cancer screening. This may be done if you have a family history of breast, ovarian, tubal, or peritoneal cancers.  Pelvic exam and Pap test. This may be done every 3 years starting at age 68. Starting at age 51, this may be done every 5 years if you have a Pap test in combination with an HPV test. Other tests  Sexually transmitted disease (STD) testing.  Bone density scan. This is done to screen for osteoporosis. You may have this scan if you are at high risk for osteoporosis. Follow these instructions at home: Eating and drinking  Eat a diet that includes fresh fruits and vegetables, whole grains, lean protein, and low-fat dairy.  Take vitamin and mineral supplements as recommended by your health care provider.  Do not drink alcohol if: ? Your health care provider tells you not to drink. ? You are pregnant, may be pregnant, or are planning to become pregnant.  If you drink alcohol: ? Limit how much you have to 0-1 drink a day. ? Be aware of how much alcohol is in your drink. In the [place] one drink equals one 12 oz bottle of beer (355 mL), one 5 oz glass of wine (148 mL), or one 1 oz glass of hard liquor (44 mL). Lifestyle  Take daily care of your teeth and gums.  Stay active. Exercise for at least 30 minutes on 5 or more days each week.  Do not use any products that contain nicotine or tobacco, such as cigarettes, e-cigarettes, and chewing tobacco. If you need help quitting, ask your health care provider.  If you are sexually active, practice safe sex. Use a condom or other form of birth control (contraception) in  order to prevent pregnancy and STIs (sexually transmitted infections).  If told by your health care provider, take low-dose aspirin daily starting at age 53. What's next?  Visit your health care provider once a year for a well check visit.  Ask your health care provider how often you should have your eyes and teeth checked.  Stay up to date on all vaccines. This information is not intended to replace advice given to you by your health care provider. Make sure you discuss any questions you have with your health care provider. Document Released: 04/26/2015 Document Revised: 12/09/2017 Document Reviewed: 12/09/2017 Elsevier Patient Education  2020 [employer].  Exercising to Lose Weight Exercise is structured, repetitive physical activity to improve fitness and health. Getting regular exercise is important for everyone. It is especially important if you are overweight. Being overweight increases your risk of heart disease, stroke, diabetes, high blood pressure, and several types of cancer. Reducing your calorie intake and exercising can help you lose weight. Exercise is usually categorized as moderate or vigorous intensity. To lose weight, most people need to do a certain amount of moderate-intensity or vigorous-intensity exercise each week. Moderate-intensity exercise  Moderate-intensity exercise is any activity that gets you moving enough to burn at least three times more energy (calories) than if you were sitting. Examples of moderate exercise include:  Walking a mile in 15 minutes.  Doing light yard work.  Biking at an easy pace. Most people should get at least 150 minutes (2 hours and 30 minutes) a week of moderate-intensity exercise to maintain their body weight. Vigorous-intensity exercise Vigorous-intensity exercise is any activity that gets you moving enough to burn at least six times more calories than if you were sitting. When you exercise at this intensity, you should be  working hard enough that you are not able to carry on a conversation. Examples of vigorous exercise include:  Running.  Playing a team sport, such as football, basketball, and soccer.  Jumping rope. Most people should get at least 75 minutes (1 hour and 15 minutes) a week of vigorous-intensity exercise to maintain their body weight. How can exercise affect me? When you exercise  enough to burn more calories than you eat, you lose weight. Exercise also reduces body fat and builds muscle. The more muscle you have, the more calories you burn. Exercise also:  Improves mood.  Reduces stress and tension.  Improves your overall fitness, flexibility, and endurance.  Increases bone strength. The amount of exercise you need to lose weight depends on:  Your age.  The type of exercise.  Any health conditions you have.  Your overall physical ability. Talk to your health care provider about how much exercise you need and what types of activities are safe for you. What actions can I take to lose weight? Nutrition   Make changes to your diet as told by your health care provider or diet and nutrition specialist (dietitian). This may include: ? Eating fewer calories. ? Eating more protein. ? Eating less unhealthy fats. ? Eating a diet that includes fresh fruits and vegetables, whole grains, low-fat dairy products, and lean protein. ? Avoiding foods with added fat, salt, and sugar.  Drink plenty of water while you exercise to prevent dehydration or heat stroke. Activity  Choose an activity that you enjoy and set realistic goals. Your health care provider can help you make an exercise plan that works for you.  Exercise at a moderate or vigorous intensity most days of the week. ? The intensity of exercise may vary from person to person. You can tell how intense a workout is for you by paying attention to your breathing and heartbeat. Most people will notice their breathing and heartbeat get  faster with more intense exercise.  Do resistance training twice each week, such as: ? Push-ups. ? Sit-ups. ? Lifting weights. ? Using resistance bands.  Getting short amounts of exercise can be just as helpful as long structured periods of exercise. If you have trouble finding time to exercise, try to include exercise in your daily routine. ? Get up, stretch, and walk around every 30 minutes throughout the day. ? Go for a walk during your lunch break. ? Park your car farther away from your destination. ? If you take public transportation, get off one stop early and walk the rest of the way. ? Make phone calls while standing up and walking around. ? Take the stairs instead of elevators or escalators.  Wear comfortable clothes and shoes with good support.  Do not exercise so much that you hurt yourself, feel dizzy, or get very short of breath. Where to find more information  [place] Department of Health and Human Services: [employer]  Centers for Disease Control and Prevention (CDC): http://www.wolf.info/ Contact a health care provider:  Before starting a new exercise program.  If you have questions or concerns about your weight.  If you have a medical problem that keeps you from exercising. Get help right away if you have any of the following while exercising:  Injury.  Dizziness.  Difficulty breathing or shortness of breath that does not go away when you stop exercising.  Chest pain.  Rapid heartbeat. Summary  Being overweight increases your risk of heart disease, stroke, diabetes, high blood pressure, and several types of cancer.  Losing weight happens when you burn more calories than you eat.  Reducing the amount of calories you eat in addition to getting regular moderate or vigorous exercise each week helps you lose weight. This information is not intended to replace advice given to you by your health care provider. Make sure you discuss any questions you have with your  health care  provider. Document Released: 05/02/2010 Document Revised: 04/12/2017 Document Reviewed: 04/12/2017 Elsevier Patient Education  2020 [employer].

## 2019-03-13 ENCOUNTER — Other Ambulatory Visit: Payer: Self-pay

## 2019-03-13 DIAGNOSIS — N951 Menopausal and female climacteric states: Secondary | ICD-10-CM

## 2019-03-13 MED ORDER — ESTRADIOL 0.025 MG/24HR TD PTTW: 1.0000 | MEDICATED_PATCH | TRANSDERMAL | 2 refills | Status: DC

## 2019-06-06 ENCOUNTER — Other Ambulatory Visit: Payer: Self-pay

## 2019-06-06 DIAGNOSIS — N951 Menopausal and female climacteric states: Secondary | ICD-10-CM

## 2019-06-06 MED ORDER — ESTRADIOL 0.025 MG/24HR TD PTTW: 1.0000 | MEDICATED_PATCH | TRANSDERMAL | 1 refills | Status: DC

## 2019-06-06 NOTE — Telephone Encounter (Signed)
Patient called and made annual exam appointment with Dr. Erin Fulling. Refill given to get her to her appointment. Armandina Stammer RN

## 2019-07-07 ENCOUNTER — Other Ambulatory Visit: Payer: Self-pay

## 2019-07-07 ENCOUNTER — Encounter: Payer: Self-pay | Admitting: Obstetrics & Gynecology

## 2019-07-07 ENCOUNTER — Ambulatory Visit (INDEPENDENT_AMBULATORY_CARE_PROVIDER_SITE_OTHER): Payer: PRIVATE HEALTH INSURANCE | Admitting: Obstetrics & Gynecology

## 2019-07-07 VITALS — BP 134/83 | HR 67 | Ht 65.0 in | Wt 182.0 lb

## 2019-07-07 DIAGNOSIS — Z01419 Encounter for gynecological examination (general) (routine) without abnormal findings: Secondary | ICD-10-CM

## 2019-07-07 DIAGNOSIS — N951 Menopausal and female climacteric states: Secondary | ICD-10-CM

## 2019-07-07 MED ORDER — ESTRADIOL 0.025 MG/24HR TD PTTW: 1.0000 | MEDICATED_PATCH | TRANSDERMAL | 12 refills | Status: DC

## 2019-07-07 NOTE — Progress Notes (Signed)
Patient is having annual today. Need year refill of vivelle dot. Patient is happy with the patch. Armandina Stammer RN

## 2019-07-07 NOTE — Patient Instructions (Signed)
Menopause and Hormone Replacement Therapy Menopause is a normal time of life when menstrual periods stop completely and the ovaries stop producing the female hormones estrogen and progesterone. This lack of hormones can affect your health and cause undesirable symptoms. Hormone replacement therapy (HRT) can relieve some of those symptoms. What is hormone replacement therapy? HRT is the use of artificial (synthetic) hormones to replace hormones that your body has stopped producing because you have reached menopause. What are my options for HRT?  HRT may consist of the synthetic hormones estrogen and progestin, or it may consist of only estrogen (estrogen-only therapy). You and your health care provider will decide which form of HRT is best for you. If you choose to be on HRT and you have a uterus, estrogen and progestin are usually prescribed. Estrogen-only therapy is used for women who do not have a uterus. Possible options for taking HRT include:  Pills.  Patches.  Gels.  Sprays.  Vaginal cream.  Vaginal rings.  Vaginal inserts. The amount of hormone(s) that you take and how long you take the hormone(s) varies according to your health. It is important to:  Begin HRT with the lowest possible dosage.  Stop HRT as soon as your health care provider tells you to stop.  Work with your health care provider so that you feel informed and comfortable with your decisions. What are the benefits of HRT? HRT can reduce the frequency and severity of menopausal symptoms. Benefits of HRT vary according to the kind of symptoms that you have, how severe they are, and your overall health. HRT may help to improve the following symptoms of menopause:  Hot flashes and night sweats. These are sudden feelings of heat that spread over the face and body. The skin may turn red, like a blush. Night sweats are hot flashes that happen while you are sleeping or trying to sleep.  Bone loss (osteoporosis). The  body loses calcium more quickly after menopause, causing the bones to become weaker. This can increase the risk for bone breaks (fractures).  Vaginal dryness. The lining of the vagina can become thin and dry, which can cause pain during sex or cause infection, burning, or itching.  Urinary tract infections.  Urinary incontinence. This is the inability to control when you pass urine.  Irritability.  Short-term memory problems. What are the risks of HRT? Risks of HRT vary depending on your individual health and medical history. Risks of HRT also depend on whether you receive both estrogen and progestin or you receive estrogen only. HRT may increase the risk of:  Spotting. This is when a small amount of blood leaks from the vagina unexpectedly.  Endometrial cancer. This cancer is in the lining of the uterus (endometrium).  Breast cancer.  Increased density of breast tissue. This can make it harder to find breast cancer on a breast X-ray (mammogram).  Stroke.  Heart disease.  Blood clots.  Gallbladder disease.  Liver disease. Risks of HRT can increase if you have any of the following conditions:  Endometrial cancer.  Liver disease.  Heart disease.  Breast cancer.  History of blood clots.  History of stroke. Follow these instructions at home:  Take over-the-counter and prescription medicines only as told by your health care provider.  Get mammograms, pelvic exams, and medical checkups as often as told by your health care provider.  Have Pap tests done as often as told by your health care provider. A Pap test is sometimes called a Pap smear. It   is a screening test that is used to check for signs of cancer of the cervix and vagina. A Pap test can also identify the presence of infection or precancerous changes. Pap tests may be done: ? Every 3 years, starting at age 21. ? Every 5 years, starting after age 30, in combination with testing for human papillomavirus  (HPV). ? More often or less often depending on other medical conditions you have, your age, and other risk factors.  It is up to you to get the results of your Pap test. Ask your health care provider, or the department that is doing the test, when your results will be ready.  Keep all follow-up visits as told by your health care provider. This is important. Contact a health care provider if you have:  Pain or swelling in your legs.  Shortness of breath.  Chest pain.  Lumps or changes in your breasts or armpits.  Slurred speech.  Pain, burning, or bleeding when you urinate.  Unusual vaginal bleeding.  Dizziness or headaches.  Weakness or numbness in any part of your arms or legs.  Pain in your abdomen. Summary  Menopause is a normal time of life when menstrual periods stop completely and the ovaries stop producing the female hormones estrogen and progesterone.  Hormone replacement therapy (HRT) can relieve some of the symptoms of menopause.  HRT can reduce the frequency and severity of menopausal symptoms.  Risks of HRT vary depending on your individual health and medical history. This information is not intended to replace advice given to you by your health care provider. Make sure you discuss any questions you have with your health care provider. Document Revised: 11/30/2017 Document Reviewed: 11/30/2017 Elsevier Patient Education  2020 [employer] Inc[employer]  

## 2019-07-07 NOTE — Progress Notes (Signed)
Subjective:     Erica Dickson is a 54 y.o. female here for a routine exam.  Current complaints: Pt reports that her family is about to move to [place], [place] as her husband's job is moving there. Her daughter, Maureen Ralphs is graduation from nursing school. She wants to do L&D. Pt reports that the dosage of the ERT is perfect and she wants to continue it.     Gynecologic History No LMP recorded. Patient has had a hysterectomy. Contraception: post menopausal status Last Pap: n/a. S/p hyst  Last mammogram: 06/06/2019   Obstetric History OB History  Gravida Para Term Preterm AB Living  1 1 1     1   SAB TAB Ectopic Multiple Live Births          1    # Outcome Date GA Lbr Len/2nd Weight Sex Delivery Anes PTL Lv  1 Term 1998 [redacted]w[redacted]d   F Vag-Spont   LIV   The following portions of the patient's history were reviewed and updated as appropriate: allergies, current medications, past family history, past medical history, past social history, past surgical history and problem list.  Review of Systems Pertinent items are noted in HPI.    Objective:  BP 134/83   Pulse 67   Ht 5\' 5"  (1.651 m)   Wt 182 lb (82.6 kg)   BMI 30.29 kg/m   General Appearance:    Alert, cooperative, no distress, appears stated age  Head:    Normocephalic, without obvious abnormality, atraumatic  Eyes:    conjunctiva/corneas clear, EOM's intact, both eyes  Ears:    Normal external ear canals, both ears  Nose:   Nares normal, septum midline, mucosa normal, no drainage    or sinus tenderness  Throat:   Lips, mucosa, and tongue normal; teeth and gums normal  Neck:   Supple, symmetrical, trachea midline, no adenopathy;    thyroid:  no enlargement/tenderness/nodules  Back:     Symmetric, no curvature, ROM normal, no CVA tenderness  Lungs:     respirations unlabored  Chest Wall:    No tenderness or deformity   Heart:    Regular rate and rhythm  Breast Exam:    No tenderness, masses, or nipple abnormality  Abdomen:     Soft,  non-tender, bowel sounds active all four quadrants,    no masses, no organomegaly  Genitalia:    Normal female without lesion, discharge or tenderness   Vagina cuff well healed.   Extremities:   Extremities normal, atraumatic, no cyanosis or edema  Pulses:   2+ and symmetric all extremities  Skin:   Skin color, texture, turgor normal, no rashes or lesions    Assessment:    Healthy female exam.   Menopausal state   Plan:   F/u in 1 year Colonoscopy in 6 years Refill Vivelle patch  Shahana Capes L. Harraway-Smith, M.D., [redacted]w[redacted]d

## 2019-07-12 IMAGING — CT CT PELVIS W/ CM
2 of 3 series · 14 of 46 positions shown, 16 images · IV contrast (iopamidol)
Comparison: None.

CLINICAL DATA: 52-year-old female with a history of gluteal
abscess, prior incision and drainage and local wound care

EXAM:
CT PELVIS WITH CONTRAST
TECHNIQUE: Multidetector CT imaging of the pelvis was performed using the
standard protocol following the bolus administration of intravenous
contrast.
CONTRAST:  100mL ET2JNL-MSS IOPAMIDOL (ET2JNL-MSS) INJECTION 61%

[Series 5: axial soft tissue · axial · 0.97mm/px · z∈[-546,-272]mm · 11 of 159 slices shown, 13 images]
[im 11/159  soft-tissue]
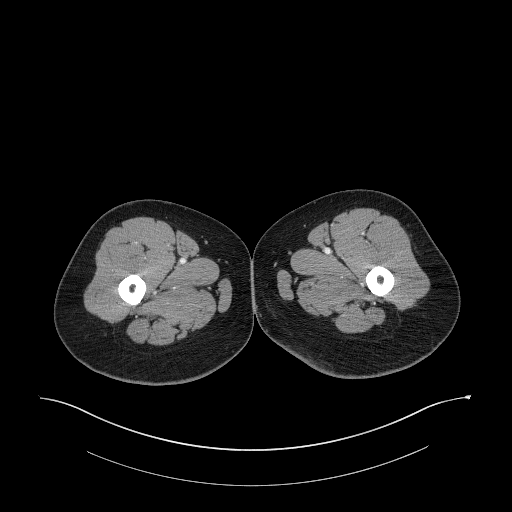
[im 11/159  bone]
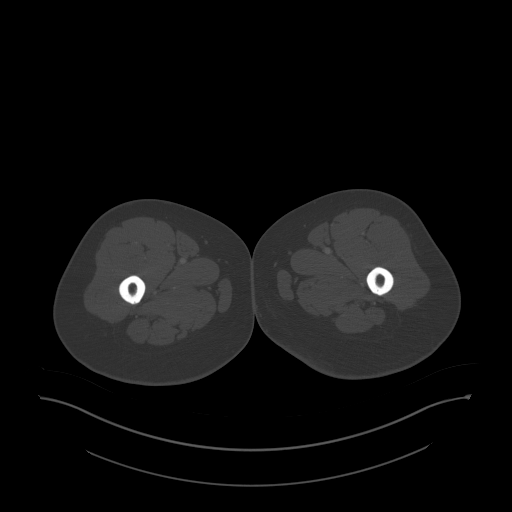
[im 26/159  soft-tissue]
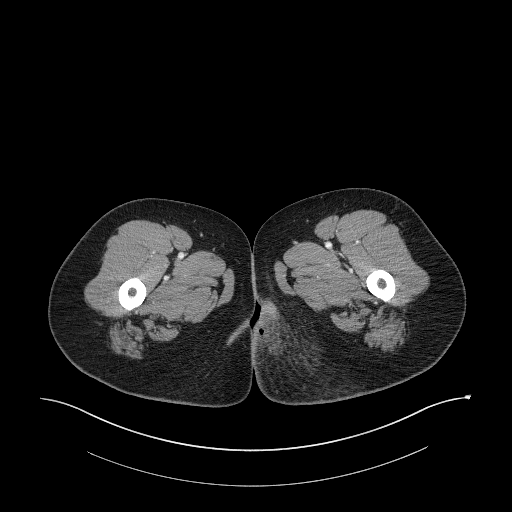
[im 36/159  soft-tissue]
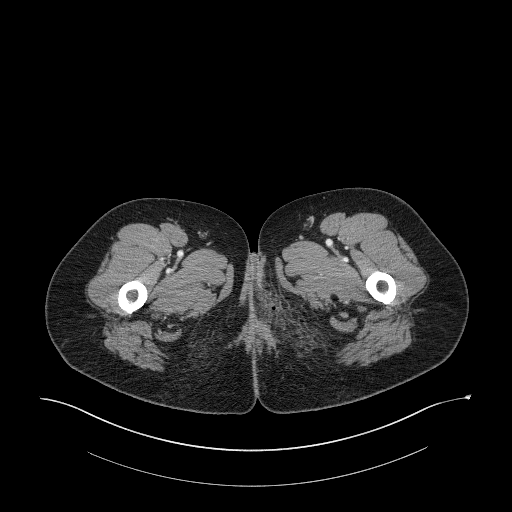
[im 51/159  soft-tissue]
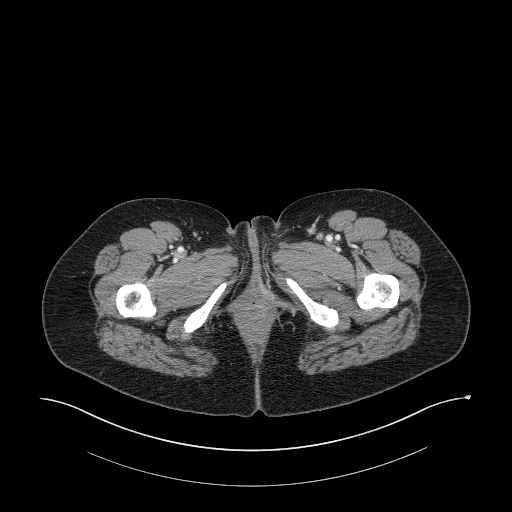
[im 67/159  soft-tissue]
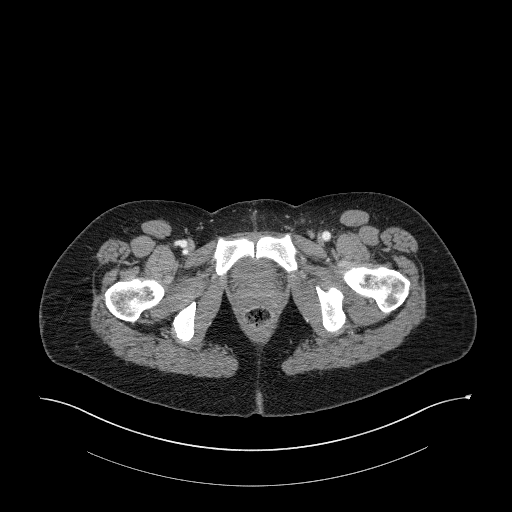
[im 82/159  soft-tissue]
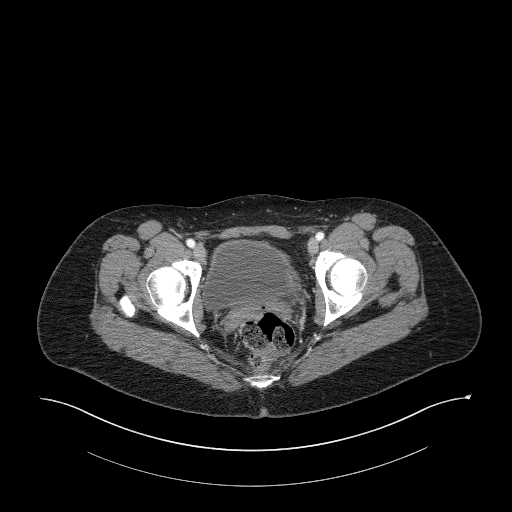
[im 92/159  soft-tissue]
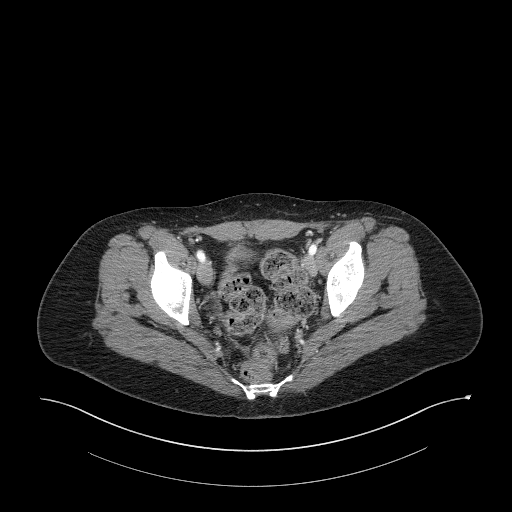
[im 108/159  soft-tissue]
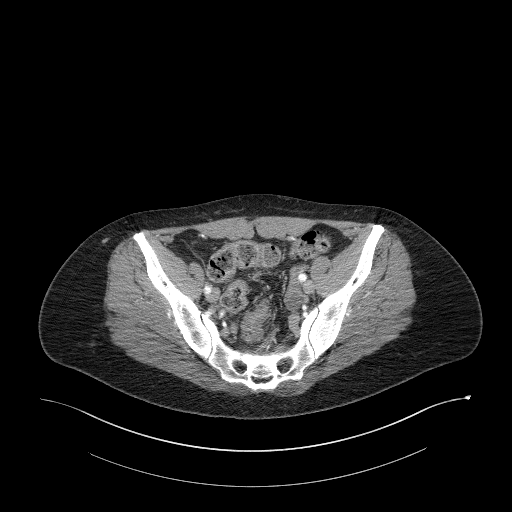
[im 123/159  soft-tissue]
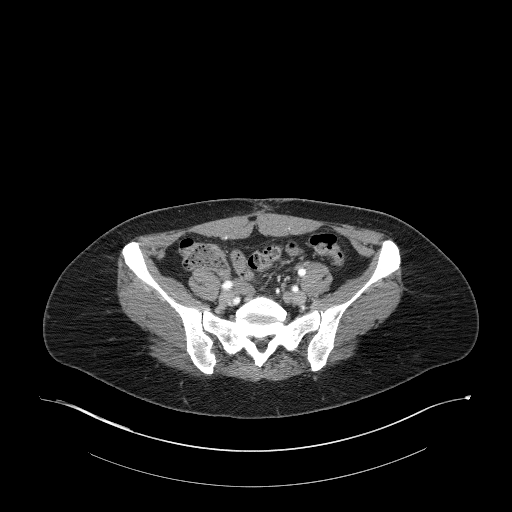
[im 123/159  bone]
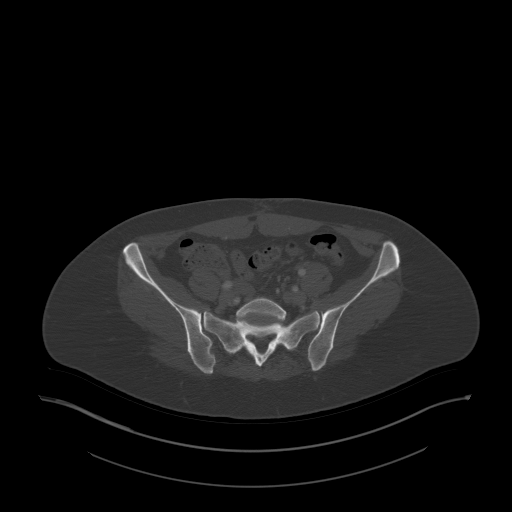
[im 133/159  soft-tissue]
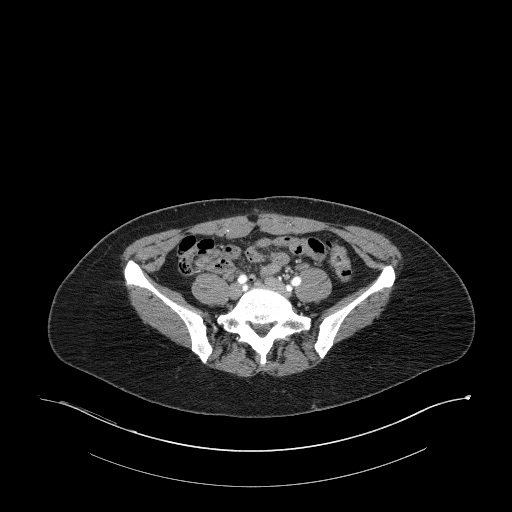
[im 148/159  soft-tissue]
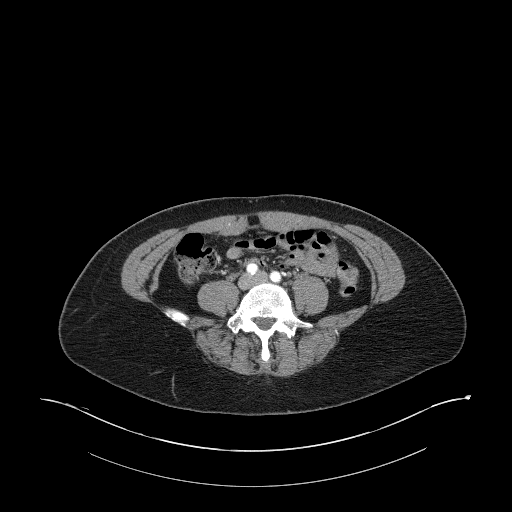

[Series 7: coronal st · coronal · 0.62mm/px · 3 of 106 slices shown]
[im 36/106  soft-tissue]
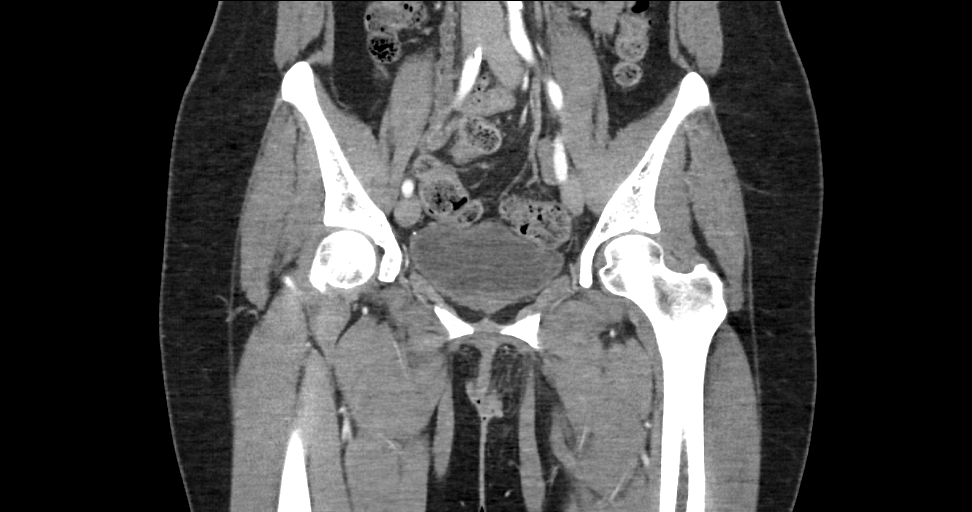
[im 47/106  soft-tissue]
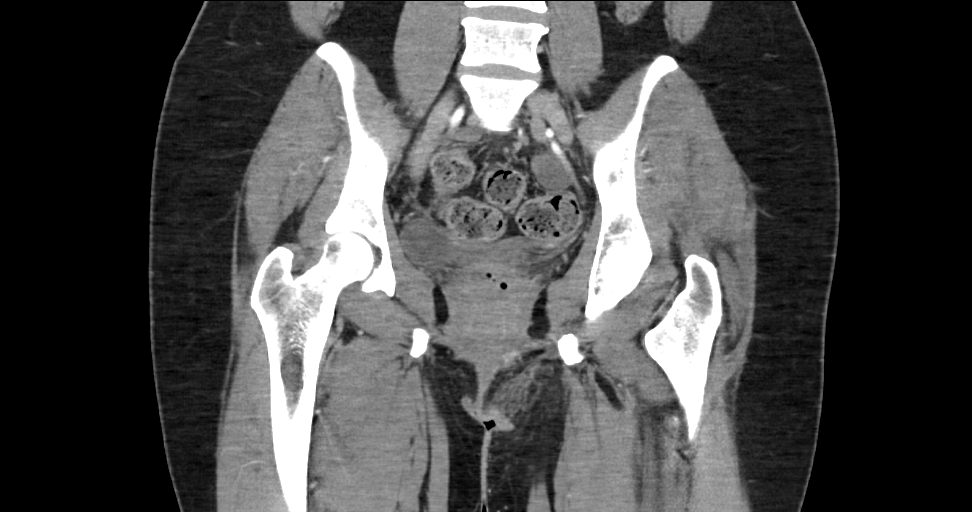
[im 59/106  soft-tissue]
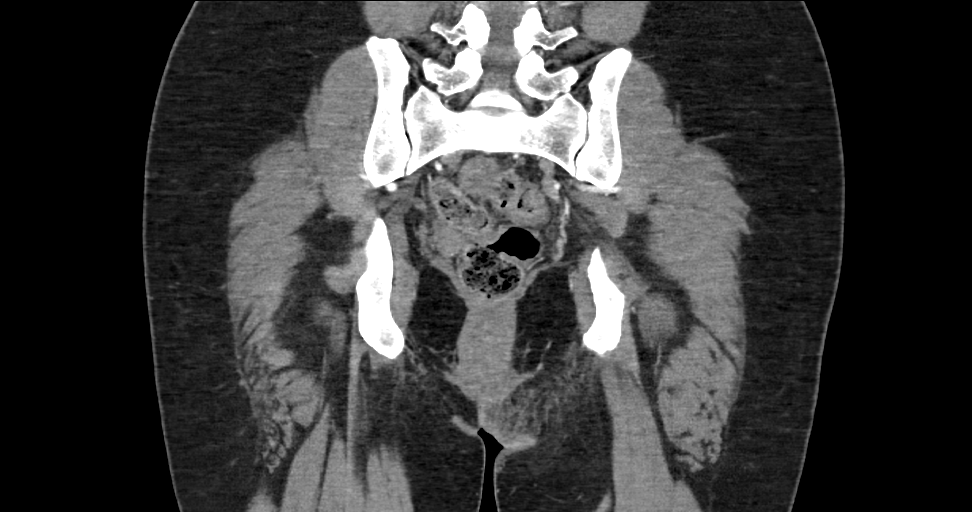

[14 of 46 positions shown; findings below may reference images not displayed]

FINDINGS: Urinary Tract:  Urinary bladder relatively decompressed.

Bowel: Visualized aspects of small bowel unremarkable. Normal
appendix is visualized. Unremarkable appearance of visualized colon
with mild stool burden

Vascular/Lymphatic: Minimal atherosclerotic changes of the
visualized arteries. Bilateral iliac arteries and proximal femoral
arteries are patent.

Borderline enlarged lymph nodes of the left inguinal region. Small
lymph nodes of the right inguinal region. Small lymph nodes of the
left external iliac nodal stations.

Reproductive: Surgical changes of hysterectomy. Follicular changes
versus small cyst of the left ovary with no comparison.

Other: Inflammatory changes with edema within the left gluteal soft
tissues extending to the midline cleft. There are a few small
locules of gas in this region. No focal fluid to represent an
abscess. Mild skin thickening.

Musculoskeletal: No acute displaced fracture.
IMPRESSION: Edema and inflammatory changes within the medial soft tissues of the
left gluteal region extending to the midline with no focal fluid.
There are a few locules of gas which are compatible with the given
history of surgical intervention. Findings suggest localized
cellulitis with no evidence of abscess.

Reactive inguinal lymph nodes, most pronounced on the left.

Left ovarian cyst or follicle measuring 2.6 cm. If the patient is
premenopausal this requires no specific follow-up. If the patient is
known to be postmenopausal, referral for Ob GYN evaluation and
initiation of annual ultrasound surveillance is warranted.

## 2019-09-19 ENCOUNTER — Ambulatory Visit (INDEPENDENT_AMBULATORY_CARE_PROVIDER_SITE_OTHER): Payer: PRIVATE HEALTH INSURANCE

## 2019-09-19 ENCOUNTER — Ambulatory Visit (INDEPENDENT_AMBULATORY_CARE_PROVIDER_SITE_OTHER): Payer: PRIVATE HEALTH INSURANCE | Admitting: Podiatry

## 2019-09-19 ENCOUNTER — Encounter: Payer: Self-pay | Admitting: Podiatry

## 2019-09-19 ENCOUNTER — Other Ambulatory Visit: Payer: Self-pay

## 2019-09-19 VITALS — BP 128/73 | HR 67 | Resp 16

## 2019-09-19 DIAGNOSIS — M7661 Achilles tendinitis, right leg: Secondary | ICD-10-CM

## 2019-09-19 DIAGNOSIS — M722 Plantar fascial fibromatosis: Secondary | ICD-10-CM

## 2019-09-19 MED ORDER — MELOXICAM 15 MG PO TABS: 15.0000 mg | ORAL_TABLET | Freq: Every day | ORAL | 3 refills | Status: AC

## 2019-09-19 MED ORDER — METHYLPREDNISOLONE 4 MG PO TBPK: ORAL_TABLET | ORAL | 0 refills | Status: AC

## 2019-09-19 NOTE — Progress Notes (Signed)
  Subjective:  Patient ID: Erica Dickson, female    DOB: 08/31/1965,  MRN: [id number] HPI Chief Complaint  Patient presents with  . Foot Pain    Plantar/posterior heel right - aching, sharp pains x 2-3 months, AM pain, no treatment  . New Patient (Initial Visit)    54 y.o. female presents with the above complaint.   ROS: Denies fever chills nausea vomiting muscle aches pains calf pain back pain chest pain shortness of breath.  Past Medical History:  Diagnosis Date  . Hepatic steatosis    MRI 01/2016  . Hyperlipidemia   . Sciatic pain   . Urinary tract infection    Past Surgical History:  Procedure Laterality Date  . ABDOMINAL HYSTERECTOMY    . AUGMENTATION MAMMAPLASTY Bilateral 2014  . TONSILLECTOMY      Current Outpatient Medications:  .  estradiol (VIVELLE-DOT) 0.025 MG/24HR, Place 1 patch onto the skin 2 (two) times a week., Disp: 8 patch, Rfl: 12 .  Multiple Vitamin (MULTI-VITAMINS) TABS, Take 1 tablet by mouth daily., Disp: , Rfl:   No Known Allergies Review of Systems Objective:   Vitals:   09/19/19 1335  BP: 128/73  Pulse: 67  Resp: 16    General: Well developed, nourished, in no acute distress, alert and oriented x3   Dermatological: Skin is warm, dry and supple bilateral. Nails x 10 are well maintained; remaining integument appears unremarkable at this time. There are no open sores, no preulcerative lesions, no rash or signs of infection present.  Vascular: Dorsalis Pedis artery and Posterior Tibial artery pedal pulses are 2/4 bilateral with immedate capillary fill time. Pedal hair growth present. No varicosities and no lower extremity edema present bilateral.   Neruologic: Grossly intact via light touch bilateral. Vibratory intact via tuning fork bilateral. Protective threshold with Semmes Wienstein monofilament intact to all pedal sites bilateral. Patellar and Achilles deep tendon reflexes 2+ bilateral. No Babinski or clonus noted bilateral.    Musculoskeletal: No gross boney pedal deformities bilateral. No pain, crepitus, or limitation noted with foot and ankle range of motion bilateral. Muscular strength 5/5 in all groups tested bilateral.  She has some pain on palpation of the Achilles central aspect of the calcaneus.  She has pain on palpation medial calcaneal tubercle.  No pain on medial and lateral compression of the calcaneus.  Gait: Unassisted, Nonantalgic.    Radiographs:  Radiographs taken today demonstrate no plantar spurs rectus foot type mild plantar fasciitis.  Soft tissue increase in density plantar fashion calcaneal insertion site there is a small amount of increased density of the Achilles at its insertion site and just on the superior aspect of the posterior calcaneus.  Assessment & Plan:   Assessment: Plantar fasciitis right foot with compensatory Achilles tendinitis.   Plan: Discussed etiology pathology conservative versus surgical therapy start her on a Medrol Dosepak to be followed by meloxicam.  Injected the right heel today with 20 mg of Kenalog 5 mg of Marcaine to the point of maximal tenderness of the right foot.  Placed her in a plantar fascial brace to be followed by a night splint discussed appropriate shoe gear stretching exercises ice therapy sugar modifications.  I will follow-up with her in 1 month    Erica Dickson, [place]

## 2019-09-19 NOTE — Patient Instructions (Signed)

## 2019-10-03 ENCOUNTER — Other Ambulatory Visit: Payer: Self-pay

## 2019-10-04 ENCOUNTER — Encounter: Payer: Self-pay | Admitting: Nurse Practitioner

## 2019-10-04 ENCOUNTER — Ambulatory Visit (INDEPENDENT_AMBULATORY_CARE_PROVIDER_SITE_OTHER): Payer: PRIVATE HEALTH INSURANCE | Admitting: Nurse Practitioner

## 2019-10-04 VITALS — BP 128/80 | HR 63 | Temp 97.0°F | Ht 65.0 in | Wt 168.2 lb

## 2019-10-04 DIAGNOSIS — K121 Other forms of stomatitis: Secondary | ICD-10-CM | POA: Diagnosis not present

## 2019-10-04 MED ORDER — MAGIC MOUTHWASH: 5.0000 mL | Freq: Four times a day (QID) | ORAL | 0 refills | Status: AC

## 2019-10-04 NOTE — Patient Instructions (Signed)
Stomatitis Stomatitis is a condition that causes swelling (inflammation) in your mouth. It can affect all or part of the inside of the mouth. The condition often affects your cheek, teeth, gums, lips, and tongue. It can also affect the tissues that produce mucus in your mouth (mucosa). Pain from stomatitis can make it hard for you to eat or drink. Very bad cases of this condition can lead to:  Not getting enough fluid in your body (dehydration).  Poor nutrition. Follow these instructions at home: Medicines  Take over-the-counter and prescription medicines only as told by your doctor.  If you were given an antibiotic medicine, take it as told by your doctor. Do not stop taking the medicine even if you start to feel better.  Do not use products that contain benzocaine in children who are younger than 2 years.  Do not drive or use heavy machinery while taking prescription pain medicine. Eating and drinking  Eat a balanced diet.  Do not eat: ? Spicy foods. ? Citrus, such as oranges. ? Foods that have sharp edges, such as chips.  Do not eat any foods that you think may be causing this condition.  Do not drink alcohol.  Drink enough fluid to keep your pee (urine) pale yellow. This will keep you hydrated. Lifestyle      Take good care of your mouth and teeth (oral hygiene): ? Gently brush your teeth with a soft toothbrush. Do this 2 times each day. ? Floss your teeth every day. ? Have your teeth cleaned regularly. Do this as told by your dentist.  If you have dentures, make sure that they fit the way that they should.  Do not use any products that contain nicotine or tobacco, such as cigarettes and e-cigarettes. If you need help quitting, ask your doctor.  Find ways to lower your stress. Try yoga or meditation. Ask your doctor for other ideas. General instructions  Use a salt-water rinse for pain as told by your doctor.  Keep all follow-up visits as told by your doctor.  This is important. Contact a doctor if:  Your symptoms get worse.  You have new symptoms, like: ? A rash. ? New symptoms that do not involve your mouth area.  Your symptoms last longer than 3 weeks.  Your symptoms go away and then come back.  You are more tired.  You feel weaker.  You stop feeling hungry.  You feel sick to your stomach (nauseous). Get help right away if you:  Have a fever.  Are not able to eat or drink.  Have a lot of bleeding in your mouth. Summary  Stomatitis is a condition that causes swelling in your mouth.  Pain from stomatitis can make it hard for you to eat or drink. Very bad cases of this condition can lead to not getting enough fluid in your body or poor nutrition.  Take medicines only as told by your doctor.  Follow instructions from your doctor on diet and lifestyle changes to help manage your symptoms. This information is not intended to replace advice given to you by your health care provider. Make sure you discuss any questions you have with your health care provider. Document Revised: 04/27/2017 Document Reviewed: 04/27/2017 Elsevier Patient Education  2020 [employer]  

## 2019-10-04 NOTE — Progress Notes (Signed)
Subjective:  Patient ID: Erica Dickson, female    DOB: 1965/09/21  Age: 54 y.o. MRN: [id number]  CC: Ear Pain (left ear hurts for last 2 weeks-sore touch-seems like its getting worse//canker sores in mouth been there since left ear started hurting)  Mouth Lesions  The current episode started more than 2 weeks ago. The onset was gradual. The problem occurs continuously. The problem has been unchanged. The problem is severe. Nothing relieves the symptoms. The symptoms are aggravated by eating, drinking and movement. Associated symptoms include ear pain and mouth sores. Pertinent negatives include no fever, no photophobia, no congestion, no ear discharge, no headaches, no hearing loss, no sore throat, no stridor, no swollen glands, no muscle aches, no neck stiffness, no cough, no URI, no wheezing, no rash and no eye pain. She has been eating less than usual. There were no sick contacts. She has received no recent medical care.  recent completion of oral prednisone. No hx of HSV infection  Reviewed past Medical, Social and Family history today.  Outpatient Medications Prior to Visit  Medication Sig Dispense Refill  . estradiol (VIVELLE-DOT) 0.025 MG/24HR Place 1 patch onto the skin 2 (two) times a week. 8 patch 12  . meloxicam (MOBIC) 15 MG tablet Take 1 tablet (15 mg total) by mouth daily. 30 tablet 3  . methylPREDNISolone (MEDROL DOSEPAK) 4 MG TBPK tablet 6 day dose pack - take as directed 21 tablet 0  . Multiple Vitamin (MULTI-VITAMINS) TABS Take 1 tablet by mouth daily.     No facility-administered medications prior to visit.    ROS See HPI  Objective:  BP 128/80   Pulse 63   Temp (!) 97 F (36.1 C) (Tympanic)   Ht 5\' 5"  (1.651 m)   Wt 168 lb 3.2 oz (76.3 kg)   SpO2 99%   BMI 27.99 kg/m   BP Readings from Last 3 Encounters:  10/04/19 128/80  09/19/19 128/73  07/07/19 134/83    Wt Readings from Last 3 Encounters:  10/04/19 168 lb 3.2 oz (76.3 kg)  07/07/19 182 lb (82.6 kg)    02/16/19 178 lb 8 oz (81 kg)    Physical Exam Vitals reviewed.  HENT:     Right Ear: Tympanic membrane, ear canal and external ear normal. No mastoid tenderness.     Left Ear: Tympanic membrane, ear canal and external ear normal. No mastoid tenderness.     Nose: Nose normal.     Mouth/Throat:     Mouth: Mucous membranes are moist.     Dentition: Normal dentition. No dental tenderness, gingival swelling, dental caries, dental abscesses or gum lesions.     Tongue: Lesions present.     Palate: No mass and lesions.     Pharynx: Uvula midline.     Tonsils: No tonsillar exudate or tonsillar abscesses.      Comments: No swollen salivary gland Cardiovascular:     Rate and Rhythm: Normal rate.     Pulses: Normal pulses.  Pulmonary:     Effort: Pulmonary effort is normal.  Musculoskeletal:     Cervical back: Normal range of motion and neck supple.  Lymphadenopathy:     Cervical: No cervical adenopathy.  Skin:    Findings: No rash.  Neurological:     Mental Status: She is alert.     Lab Results  Component Value Date   WBC 4.6 02/16/2019   HGB 14.0 02/16/2019   HCT 42.5 02/16/2019   PLT 246.0 02/16/2019  GLUCOSE 112 (H) 02/16/2019   CHOL 294 (H) 02/16/2019   TRIG 193.0 (H) 02/16/2019   HDL 55.20 02/16/2019   LDLCALC 200 (H) 02/16/2019   ALT 26 02/16/2019   AST 21 02/16/2019   NA 139 02/16/2019   K 4.7 02/16/2019   CL 103 02/16/2019   CREATININE 0.82 02/16/2019   BUN 15 02/16/2019   CO2 28 02/16/2019   TSH 2.71 02/16/2019   HGBA1C 5.2 03/18/2016    Assessment & Plan:  This visit occurred during the SARS-CoV-2 public health emergency.  Safety protocols were in place, including screening questions prior to the visit, additional usage of staff PPE, and extensive cleaning of exam room while observing appropriate contact time as indicated for disinfecting solutions.   Deanda was seen today for ear pain.  Diagnoses and all orders for this visit:  Stomatitis -      magic mouthwash SOLN; Take 5 mLs by mouth 4 (four) times daily. Combine equal parts of nystatin, maalox, and diphenhydramine to make up . -     Herpes simplex virus culture   I am having Ashok Pall start on magic mouthwash. I am also having her maintain her Multi-Vitamins, estradiol, methylPREDNISolone, and meloxicam.  Meds ordered this encounter  Medications  . magic mouthwash SOLN    Sig: Take 5 mLs by mouth 4 (four) times daily. Combine equal parts of nystatin, maalox, and diphenhydramine to make up .    Dispense:  240 mL    Refill:  0    Order Specific Question:   Supervising Provider    Answer:   Overton Mam [[id number]]    Problem List Items Addressed This Visit    None    Visit Diagnoses    Stomatitis    -  Primary   Relevant Medications   magic mouthwash SOLN   Other Relevant Orders   Herpes simplex virus culture      Follow-up: No follow-ups on file.  Alysia Penna, NP

## 2019-10-06 LAB — HERPES SIMPLEX VIRUS CULTURE
MICRO NUMBER:: 10625286
SPECIMEN QUALITY:: ADEQUATE

## 2019-10-19 ENCOUNTER — Ambulatory Visit: Payer: PRIVATE HEALTH INSURANCE | Admitting: Podiatry

## 2020-03-26 ENCOUNTER — Other Ambulatory Visit: Payer: Self-pay

## 2020-03-26 DIAGNOSIS — N951 Menopausal and female climacteric states: Secondary | ICD-10-CM

## 2020-03-26 MED ORDER — ESTRADIOL 0.025 MG/24HR TD PTTW: 1.0000 | MEDICATED_PATCH | TRANSDERMAL | 3 refills | Status: AC

## 2020-03-26 NOTE — Progress Notes (Signed)
Pt called requesting a refill of Estadiol patches because her pharmacy had trouble transferring her Rx. Pt states she moved to [place] and has a new Gyn appt scheduled for Feb 9th. Medication refill was sent to pt's new pharmacy.  Latrel Szymczak l Pritika Alvarez, CMA
